# Patient Record
Sex: Female | Born: 2000 | Race: White | Hispanic: No | Marital: Single | Smoking: Former smoker
Health system: Southern US, Community
[De-identification: ages and names within clinical notes are randomized; demographics above are authoritative.]

## PROBLEM LIST (undated history)

## (undated) ENCOUNTER — Inpatient Hospital Stay (HOSPITAL_COMMUNITY): Payer: Medicaid Other

## (undated) DIAGNOSIS — Z789 Other specified health status: Secondary | ICD-10-CM

## (undated) DIAGNOSIS — O139 Gestational [pregnancy-induced] hypertension without significant proteinuria, unspecified trimester: Secondary | ICD-10-CM

## (undated) HISTORY — DX: Gestational (pregnancy-induced) hypertension without significant proteinuria, unspecified trimester: O13.9

## (undated) HISTORY — DX: Other specified health status: Z78.9

## (undated) HISTORY — PX: APPENDECTOMY: SHX54

---
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
## 2020-01-03 ENCOUNTER — Other Ambulatory Visit: Payer: Self-pay

## 2020-01-03 ENCOUNTER — Ambulatory Visit (INDEPENDENT_AMBULATORY_CARE_PROVIDER_SITE_OTHER): Payer: Medicaid Other | Admitting: Advanced Practice Midwife

## 2020-01-03 VITALS — BP 136/79 | HR 123 | Wt 182.0 lb

## 2020-01-03 DIAGNOSIS — Z3A32 32 weeks gestation of pregnancy: Secondary | ICD-10-CM

## 2020-01-03 DIAGNOSIS — Z1389 Encounter for screening for other disorder: Secondary | ICD-10-CM

## 2020-01-03 DIAGNOSIS — Z3403 Encounter for supervision of normal first pregnancy, third trimester: Secondary | ICD-10-CM

## 2020-01-03 DIAGNOSIS — Z331 Pregnant state, incidental: Secondary | ICD-10-CM

## 2020-01-03 LAB — POCT URINALYSIS DIPSTICK OB
Blood, UA: NEGATIVE
Glucose, UA: NEGATIVE
Ketones, UA: NEGATIVE
Leukocytes, UA: NEGATIVE
Nitrite, UA: NEGATIVE
POC,PROTEIN,UA: NEGATIVE

## 2020-01-03 NOTE — Patient Instructions (Signed)
Meet the Provider Zoom Sessions      [facility] is now offering FREE monthly 1-hour virtual Zoom sessions for new, current, and prospective patients.        During these sessions, you can:   Learn about our practice, model of care, services   Get answers to questions about pregnancy and birth during COVID   Pick your provider's brain about anything else!    Sessions will be hosted by [facility] Nurse Practitioners, Physician Assistants, Physicians and Midwives          No registration required      2021 Dates:      All at 6pm     October 21st     November 18th   December 16th     January 20th  February 17th    To join one of these meetings, a few minutes before it is set to start:     Copy/paste the link into your web browser:  [email or web address]    OR  Scan the QR code below (open up your camera and point towards QR code; click on tab that pops up on your phone ("zoom")     

## 2020-01-03 NOTE — Progress Notes (Signed)
   LOW-RISK PREGNANCY VISIT Patient name: Stephanie Molina MRN [id number]  Date of birth: 09-May-2000 Chief Complaint:   Routine Prenatal Visit  History of Present Illness:   Stephanie Molina is a 19 y.o. G2P0000 female at [redacted]w[redacted]d with an Estimated Date of Delivery: 02/24/20 being seen today for ongoing management of a low-risk pregnancy.  Today she reports LBP, especially after being on feet all day. Contractions: Irregular. Vag. Bleeding: None.  Movement: Present. denies leaking of fluid. Review of Systems:   Pertinent items are noted in HPI Denies abnormal vaginal discharge w/ itching/odor/irritation, headaches, visual changes, shortness of breath, chest pain, abdominal pain, severe nausea/vomiting, or problems with urination or bowel movements unless otherwise stated above. Pertinent History Reviewed:  Reviewed past medical,surgical, social, obstetrical and family history.  Reviewed problem list, medications and allergies. Physical Assessment:   Vitals:   01/03/20 1503  BP: 136/79  Pulse: (!) 123  Weight: 182 lb (82.6 kg)  Body mass index is 30.52 kg/m.        Physical Examination:   General appearance: Well appearing, and in no distress  Mental status: Alert, oriented to person, place, and time  Skin: Warm & dry  Cardiovascular: Normal heart rate noted  Respiratory: Normal respiratory effort, no distress  Abdomen: Soft, gravid, nontender  Pelvic: Cervical exam deferred         Extremities: Edema: None  Fetal Status: Fetal Heart Rate (bpm): 148 Fundal Height: 32 cm Movement: Present    Chaperone: n/a    Results for orders placed or performed in visit on 01/03/20 (from the past 24 hour(s))  POC Urinalysis Dipstick OB   Collection Time: 01/03/20  3:02 PM  Result Value Ref Range   Color, UA     Clarity, UA     Glucose, UA Negative Negative   Bilirubin, UA     Ketones, UA neg    Spec Grav, UA     Blood, UA neg    pH, UA     POC,PROTEIN,UA Negative Negative, Trace, Small  (1+), Moderate (2+), Large (3+), 4+   Urobilinogen, UA     Nitrite, UA neg    Leukocytes, UA Negative Negative   Appearance     Odor      Assessment & Plan:  1) Low-risk pregnancy G1P0000 at [redacted]w[redacted]d with an Estimated Date of Delivery: 02/24/20   2) LBP, try maternity belt   Meds: No orders of the defined types were placed in this encounter.  Labs/procedures today:   Plan:  Continue routine obstetrical care  Next visit: prefers online    Reviewed: Preterm labor symptoms and general obstetric precautions including but not limited to vaginal bleeding, contractions, leaking of fluid and fetal movement were reviewed in detail with the patient.  All questions were answered. Has home bp cuff. Check bp weekly, let us know if >140/90.   Follow-up: Return in about 2 weeks (around 01/17/2020) for [facility] Mychart visit.  Orders Placed This Encounter  Procedures  . POC Urinalysis Dipstick OB   Jacklyn Shell DNP, CNM 01/03/2020 3:16 PM

## 2020-01-17 ENCOUNTER — Telehealth: Payer: Medicaid Other | Admitting: Women's Health

## 2020-01-22 NOTE — L&D Delivery Note (Addendum)
OB/GYN Faculty Practice Delivery Note  Stephanie Molina is a 20 y.o. G1P0000 at [redacted]w[redacted]d. She was admitted for PROM on 02/12/20.   ROM: 10h 65m with clear fluid GBS Status: positive, PCN ppx adequate Maximum Maternal Temperature: 98.6*F  Labor Progress: . Patient received cytotec and FB, and then started on pitocin. AROM at 2300 1/22 and IUPC placed and progressed to complete.   Delivery Date/Time: 02/13/20 at [phone] Delivery: Called to room and patient was complete and pushing. Head delivered LOA, with compound right hand and tight nuchal cord x1, delivered through. Shoulder and body delivered in usual fashion. Infant with spontaneous cry, placed on mother's abdomen, dried and stimulated. Cord clamped x 2 after 1-minute delay, and cut by FOB. Cord blood drawn. Placenta delivered spontaneously with gentle cord traction. Fundus firm with massage and Pitocin. Labia, perineum, vagina, and cervix inspected with first degree perineal laceration and left labial wall laceration.   Placenta: spontaneous, intact, 3VC Complications: none Lacerations: first degree perineal, left labial wall, repaired 3.0 and 2.0 vicryl EBL: 402 cc Analgesia: epidural  Infant: female  APGARs 8,9  weight pending per medical chart  to newborn/skin-to-skin  Shirlean Mylar, MD [facility], PGY-2 02/13/2020, 10:18 AM   GME ATTESTATION:  I saw and evaluated the patient. I agree with the findings and the plan of care as documented in the resident's note.  Alric Seton, MD OB Fellow, Faculty [facility], [facility] for [facility] 02/13/2020 11:10 AM

## 2020-01-28 ENCOUNTER — Telehealth (INDEPENDENT_AMBULATORY_CARE_PROVIDER_SITE_OTHER): Payer: Medicaid Other | Admitting: Medical

## 2020-01-28 ENCOUNTER — Other Ambulatory Visit: Payer: Self-pay

## 2020-01-28 ENCOUNTER — Encounter: Payer: Self-pay | Admitting: Medical

## 2020-01-28 VITALS — BP 113/83

## 2020-01-28 DIAGNOSIS — Z3A36 36 weeks gestation of pregnancy: Secondary | ICD-10-CM | POA: Diagnosis not present

## 2020-01-28 DIAGNOSIS — Z3403 Encounter for supervision of normal first pregnancy, third trimester: Secondary | ICD-10-CM

## 2020-01-28 DIAGNOSIS — Z3402 Encounter for supervision of normal first pregnancy, second trimester: Secondary | ICD-10-CM

## 2020-01-28 NOTE — Patient Instructions (Signed)
Fetal Movement Counts Patient Name: ________________________________________________ Patient Due Date: ____________________ What is a fetal movement count?  A fetal movement count is the number of times that you feel your baby move during a certain amount of time. This may also be called a fetal kick count. A fetal movement count is recommended for every pregnant woman. You may be asked to start counting fetal movements as early as week 28 of your pregnancy. Pay attention to when your baby is most active. You may notice your baby's sleep and wake cycles. You may also notice things that make your baby move more. You should do a fetal movement count:  When your baby is normally most active.  At the same time each day. A good time to count movements is while you are resting, after having something to eat and drink. How do I count fetal movements? 1. Find a quiet, comfortable area. Sit, or lie down on your side. 2. Write down the date, the start time and stop time, and the number of movements that you felt between those two times. Take this information with you to your health care visits. 3. Write down your start time when you feel the first movement. 4. Count kicks, flutters, swishes, rolls, and jabs. You should feel at least 10 movements. 5. You may stop counting after you have felt 10 movements, or if you have been counting for 2 hours. Write down the stop time. 6. If you do not feel 10 movements in 2 hours, contact your health care provider for further instructions. Your health care provider may want to do additional tests to assess your baby's well-being. Contact a health care provider if:  You feel fewer than 10 movements in 2 hours.  Your baby is not moving like he or she usually does. Date: ____________ Start time: ____________ Stop time: ____________ Movements: ____________ Date: ____________ Start time: ____________ Stop time: ____________ Movements: ____________ Date: ____________  Start time: ____________ Stop time: ____________ Movements: ____________ Date: ____________ Start time: ____________ Stop time: ____________ Movements: ____________ Date: ____________ Start time: ____________ Stop time: ____________ Movements: ____________ Date: ____________ Start time: ____________ Stop time: ____________ Movements: ____________ Date: ____________ Start time: ____________ Stop time: ____________ Movements: ____________ Date: ____________ Start time: ____________ Stop time: ____________ Movements: ____________ Date: ____________ Start time: ____________ Stop time: ____________ Movements: ____________ This information is not intended to replace advice given to you by your health care provider. Make sure you discuss any questions you have with your health care provider. Document Revised: 08/27/2018 Document Reviewed: 08/27/2018 Elsevier Patient Education  2020 [employer] Braxton Hicks Contractions Contractions of the uterus can occur throughout pregnancy, but they are not always a sign that you are in labor. You may have practice contractions called Braxton Hicks contractions. These false labor contractions are sometimes confused with true labor. What are Braxton Hicks contractions? Braxton Hicks contractions are tightening movements that occur in the muscles of the uterus before labor. Unlike true labor contractions, these contractions do not result in opening (dilation) and thinning of the cervix. Toward the end of pregnancy (32-34 weeks), Braxton Hicks contractions can happen more often and may become stronger. These contractions are sometimes difficult to tell apart from true labor because they can be very uncomfortable. You should not feel embarrassed if you go to the hospital with false labor. Sometimes, the only way to tell if you are in true labor is for your health care provider to look for changes in the cervix. The health care provider   will do a physical exam and may  monitor your contractions. If you are not in true labor, the exam should show that your cervix is not dilating and your water has not broken. If there are no other health problems associated with your pregnancy, it is completely safe for you to be sent home with false labor. You may continue to have Braxton Hicks contractions until you go into true labor. How to tell the difference between true labor and false labor True labor  Contractions last 30-70 seconds.  Contractions become very regular.  Discomfort is usually felt in the top of the uterus, and it spreads to the lower abdomen and low back.  Contractions do not go away with walking.  Contractions usually become more intense and increase in frequency.  The cervix dilates and gets thinner. False labor  Contractions are usually shorter and not as strong as true labor contractions.  Contractions are usually irregular.  Contractions are often felt in the front of the lower abdomen and in the groin.  Contractions may go away when you walk around or change positions while lying down.  Contractions get weaker and are shorter-lasting as time goes on.  The cervix usually does not dilate or become thin. Follow these instructions at home:   Take over-the-counter and prescription medicines only as told by your health care provider.  Keep up with your usual exercises and follow other instructions from your health care provider.  Eat and drink lightly if you think you are going into labor.  If Braxton Hicks contractions are making you uncomfortable: ? Change your position from lying down or resting to walking, or change from walking to resting. ? Sit and rest in a tub of warm water. ? Drink enough fluid to keep your urine pale yellow. Dehydration may cause these contractions. ? Do slow and deep breathing several times an hour.  Keep all follow-up prenatal visits as told by your health care provider. This is important. Contact a  health care provider if:  You have a fever.  You have continuous pain in your abdomen. Get help right away if:  Your contractions become stronger, more regular, and closer together.  You have fluid leaking or gushing from your vagina.  You pass blood-tinged mucus (bloody show).  You have bleeding from your vagina.  You have low back pain that you never had before.  You feel your baby's head pushing down and causing pelvic pressure.  Your baby is not moving inside you as much as it used to. Summary  Contractions that occur before labor are called Braxton Hicks contractions, false labor, or practice contractions.  Braxton Hicks contractions are usually shorter, weaker, farther apart, and less regular than true labor contractions. True labor contractions usually become progressively stronger and regular, and they become more frequent.  Manage discomfort from Braxton Hicks contractions by changing position, resting in a warm bath, drinking plenty of water, or practicing deep breathing. This information is not intended to replace advice given to you by your health care provider. Make sure you discuss any questions you have with your health care provider. Document Revised: 12/20/2016 Document Reviewed: 05/23/2016 Elsevier Patient Education  2020 Elsevier Inc.  

## 2020-01-28 NOTE — Progress Notes (Signed)
I connected with Stephanie Molina 01/28/20 at 10:50 AM EST by: MyChart video and verified that I am speaking with the correct person using two identifiers.  Patient is located at home and provider is located at [facility].     The purpose of this virtual visit is to provide medical care while limiting exposure to the novel coronavirus. I discussed the limitations, risks, security and privacy concerns of performing an evaluation and management service by MyChart video and the availability of in person appointments. I also discussed with the patient that there may be a patient responsible charge related to this service. By engaging in this virtual visit, you consent to the provision of healthcare.  Additionally, you authorize for your insurance to be billed for the services provided during this visit.  The patient expressed understanding and agreed to proceed.  The following staff members participated in the virtual visit:  Jobe Marker, RN    PRENATAL VISIT NOTE  Subjective:  Stephanie Molina is a 20 y.o. G1P0000 at [redacted]w[redacted]d  for phone visit for ongoing prenatal care.  She is currently monitored for the following issues for this low-risk pregnancy and has Acute appendicitis and Supervision of normal first pregnancy on their problem list.  Patient reports no complaints.  Contractions: Irregular. Vag. Bleeding: None.  Movement: Present. Denies leaking of fluid.   The following portions of the patient's history were reviewed and updated as appropriate: allergies, current medications, past family history, past medical history, past social history, past surgical history and problem list.   Objective:   Vitals:   01/28/20 1038  BP: 113/83   Self-Obtained  Fetal Status:     Movement: Present     Assessment and Plan:  Pregnancy: G1P0000 at [redacted]w[redacted]d 1. Encounter for supervision of normal first pregnancy in second trimester - Doing well - Anticipatory guidance for next visit discussed including need for  GBS and GC/Chlamydia   2. [redacted] weeks gestation of pregnancy  Preterm labor symptoms and general obstetric precautions including but not limited to vaginal bleeding, contractions, leaking of fluid and fetal movement were reviewed in detail with the patient.  Return in about 1 week (around 02/04/2020) for LOB, In-Person.  No future appointments.   Time spent on virtual visit: 7 minutes  Vonzella Nipple, PA-C

## 2020-02-03 ENCOUNTER — Ambulatory Visit (INDEPENDENT_AMBULATORY_CARE_PROVIDER_SITE_OTHER): Payer: Medicaid Other | Admitting: Obstetrics & Gynecology

## 2020-02-03 ENCOUNTER — Other Ambulatory Visit (HOSPITAL_COMMUNITY)
Admission: RE | Admit: 2020-02-03 | Discharge: 2020-02-03 | Disposition: A | Discharge status: Home / Self Care | Payer: Medicaid Other | Source: Ambulatory Visit | Attending: Obstetrics & Gynecology | Admitting: Obstetrics & Gynecology

## 2020-02-03 ENCOUNTER — Encounter: Payer: Self-pay | Admitting: Obstetrics & Gynecology

## 2020-02-03 ENCOUNTER — Other Ambulatory Visit: Payer: Self-pay

## 2020-02-03 VITALS — BP 120/81 | HR 105 | Wt 191.4 lb

## 2020-02-03 DIAGNOSIS — Z1389 Encounter for screening for other disorder: Secondary | ICD-10-CM

## 2020-02-03 DIAGNOSIS — Z3403 Encounter for supervision of normal first pregnancy, third trimester: Secondary | ICD-10-CM

## 2020-02-03 DIAGNOSIS — Z331 Pregnant state, incidental: Secondary | ICD-10-CM

## 2020-02-03 DIAGNOSIS — Z3A37 37 weeks gestation of pregnancy: Secondary | ICD-10-CM

## 2020-02-03 LAB — POCT URINALYSIS DIPSTICK OB
Blood, UA: NEGATIVE
Glucose, UA: NEGATIVE
Ketones, UA: NEGATIVE
Leukocytes, UA: NEGATIVE
Nitrite, UA: NEGATIVE
POC,PROTEIN,UA: NEGATIVE

## 2020-02-03 NOTE — Progress Notes (Signed)
LOW-RISK PREGNANCY VISIT Patient name: Stephanie Molina MRN [id number]  Date of birth: Aug 31, 2000 Chief Complaint:   Routine Prenatal Visit  History of Present Illness:   Stephanie Molina is a 20 y.o. G31P0000 female at [redacted]w[redacted]d with an Estimated Date of Delivery: 02/24/20 being seen today for ongoing management of a low-risk pregnancy.  Depression screen [facility] 2/9 11/26/2019 08/12/2019 06/30/2019  Decreased Interest 0 1 0  Down, Depressed, Hopeless 0 0 0  PHQ - 2 Score 0 1 0  Altered sleeping 0 2 1  Tired, decreased energy 1 1 1   Change in appetite 0 0 1  Feeling bad or failure about yourself  0 0 0  Trouble concentrating 0 0 0  Moving slowly or fidgety/restless 0 0 0  Suicidal thoughts 0 0 0  PHQ-9 Score 1 4 3   Difficult doing work/chores - - Not difficult at all    Today she reports no complaints. Contractions: Irregular.  .  Movement: Present. denies leaking of fluid. Review of Systems:   Pertinent items are noted in HPI Denies abnormal vaginal discharge w/ itching/odor/irritation, headaches, visual changes, shortness of breath, chest pain, abdominal pain, severe nausea/vomiting, or problems with urination or bowel movements unless otherwise stated above. Pertinent History Reviewed:  Reviewed past medical,surgical, social, obstetrical and family history.  Reviewed problem list, medications and allergies. Physical Assessment:   Vitals:   02/03/20 1139  BP: 120/81  Pulse: (!) 105  Weight: 191 lb 6.4 oz (86.8 kg)  Body mass index is 32.1 kg/m.        Physical Examination:   General appearance: Well appearing, and in no distress  Mental status: Alert, oriented to person, place, and time  Skin: Warm & dry  Cardiovascular: Normal heart rate noted  Respiratory: Normal respiratory effort, no distress  Abdomen: Soft, gravid, nontender  Pelvic: Cervical exam performed  Dilation: 1 Effacement (%): Thick Station: -3  Extremities: Edema: None  Fetal Status: Fetal Heart Rate (bpm): 162  Fundal Height: 35 cm Movement: Present Presentation: Vertex  Chaperone: Amanda Rash    Results for orders placed or performed in visit on 02/03/20 (from the past 24 hour(s))  POC Urinalysis Dipstick OB   Collection Time: 02/03/20 11:47 AM  Result Value Ref Range   Color, UA     Clarity, UA     Glucose, UA Negative Negative   Bilirubin, UA     Ketones, UA neg    Spec Grav, UA     Blood, UA neg    pH, UA     POC,PROTEIN,UA Negative Negative, Trace, Small (1+), Moderate (2+), Large (3+), 4+   Urobilinogen, UA     Nitrite, UA neg    Leukocytes, UA Negative Negative   Appearance     Odor      Assessment & Plan:  1) Low-risk pregnancy G1P0000 at [redacted]w[redacted]d with an Estimated Date of Delivery: 02/24/20   2) ,    Meds: No orders of the defined types were placed in this encounter.  Labs/procedures today: cultures  Plan:  Continue routine obstetrical care  Next visit: prefers in person    Reviewed: Term labor symptoms and general obstetric precautions including but not limited to vaginal bleeding, contractions, leaking of fluid and fetal movement were reviewed in detail with the patient.  All questions were answered. Has home bp cuff. Rx faxed to . Check bp weekly, let [redacted]w[redacted]d know if >140/90.   Follow-up: Return in about 1 week (around 02/10/2020) for LROB.  Orders Placed This Encounter  Procedures  . Strep Gp B NAA  . POC Urinalysis Dipstick OB    Lazaro Arms, MD 02/03/2020 12:03 PM

## 2020-02-04 LAB — CERVICOVAGINAL ANCILLARY ONLY
Chlamydia: NEGATIVE
Comment: NEGATIVE
Comment: NORMAL
Neisseria Gonorrhea: NEGATIVE

## 2020-02-05 LAB — STREP GP B NAA: Strep Gp B NAA: POSITIVE — AB

## 2020-02-10 ENCOUNTER — Encounter: Payer: Self-pay | Admitting: *Deleted

## 2020-02-11 ENCOUNTER — Inpatient Hospital Stay (HOSPITAL_COMMUNITY)
Admission: AD | Admit: 2020-02-11 | Discharge: 2020-02-15 | DRG: 807 | Disposition: A | Discharge status: Home / Self Care | Payer: Medicaid Other | Attending: Family Medicine | Admitting: Family Medicine

## 2020-02-11 ENCOUNTER — Encounter (HOSPITAL_COMMUNITY): Payer: Self-pay | Admitting: Family Medicine

## 2020-02-11 ENCOUNTER — Encounter: Payer: Medicaid Other | Admitting: Obstetrics and Gynecology

## 2020-02-11 ENCOUNTER — Other Ambulatory Visit: Payer: Self-pay

## 2020-02-11 ENCOUNTER — Ambulatory Visit (INDEPENDENT_AMBULATORY_CARE_PROVIDER_SITE_OTHER): Payer: Medicaid Other | Admitting: Women's Health

## 2020-02-11 ENCOUNTER — Encounter: Payer: Self-pay | Admitting: Women's Health

## 2020-02-11 VITALS — BP 139/88 | HR 98 | Wt 196.0 lb

## 2020-02-11 DIAGNOSIS — E669 Obesity, unspecified: Secondary | ICD-10-CM | POA: Diagnosis present

## 2020-02-11 DIAGNOSIS — Z3400 Encounter for supervision of normal first pregnancy, unspecified trimester: Secondary | ICD-10-CM

## 2020-02-11 DIAGNOSIS — O99214 Obesity complicating childbirth: Secondary | ICD-10-CM | POA: Diagnosis present

## 2020-02-11 DIAGNOSIS — O139 Gestational [pregnancy-induced] hypertension without significant proteinuria, unspecified trimester: Secondary | ICD-10-CM | POA: Diagnosis present

## 2020-02-11 DIAGNOSIS — Z3A38 38 weeks gestation of pregnancy: Secondary | ICD-10-CM

## 2020-02-11 DIAGNOSIS — Z3403 Encounter for supervision of normal first pregnancy, third trimester: Secondary | ICD-10-CM

## 2020-02-11 DIAGNOSIS — O4292 Full-term premature rupture of membranes, unspecified as to length of time between rupture and onset of labor: Secondary | ICD-10-CM | POA: Diagnosis present

## 2020-02-11 DIAGNOSIS — Z23 Encounter for immunization: Secondary | ICD-10-CM

## 2020-02-11 DIAGNOSIS — O326XX0 Maternal care for compound presentation, not applicable or unspecified: Secondary | ICD-10-CM | POA: Diagnosis not present

## 2020-02-11 DIAGNOSIS — Z20822 Contact with and (suspected) exposure to covid-19: Secondary | ICD-10-CM | POA: Diagnosis present

## 2020-02-11 DIAGNOSIS — O135 Gestational [pregnancy-induced] hypertension without significant proteinuria, complicating the puerperium: Secondary | ICD-10-CM | POA: Diagnosis not present

## 2020-02-11 DIAGNOSIS — Z87891 Personal history of nicotine dependence: Secondary | ICD-10-CM

## 2020-02-11 DIAGNOSIS — O358XX0 Maternal care for other (suspected) fetal abnormality and damage, not applicable or unspecified: Secondary | ICD-10-CM | POA: Diagnosis present

## 2020-02-11 DIAGNOSIS — O133 Gestational [pregnancy-induced] hypertension without significant proteinuria, third trimester: Secondary | ICD-10-CM

## 2020-02-11 DIAGNOSIS — O134 Gestational [pregnancy-induced] hypertension without significant proteinuria, complicating childbirth: Principal | ICD-10-CM | POA: Diagnosis present

## 2020-02-11 DIAGNOSIS — O99824 Streptococcus B carrier state complicating childbirth: Secondary | ICD-10-CM | POA: Diagnosis present

## 2020-02-11 LAB — CBC WITH DIFFERENTIAL/PLATELET
Abs Immature Granulocytes: 0.27 10*3/uL — ABNORMAL HIGH (ref 0.00–0.07)
Basophils Absolute: 0 10*3/uL (ref 0.0–0.1)
Basophils Relative: 0 %
Eosinophils Absolute: 0.1 10*3/uL (ref 0.0–0.5)
Eosinophils Relative: 1 %
HCT: 39.5 % (ref 36.0–46.0)
Hemoglobin: 13 g/dL (ref 12.0–15.0)
Immature Granulocytes: 2 %
Lymphocytes Relative: 19 %
Lymphs Abs: 2.7 10*3/uL (ref 0.7–4.0)
MCH: 30.2 pg (ref 26.0–34.0)
MCHC: 32.9 g/dL (ref 30.0–36.0)
MCV: 91.9 fL (ref 80.0–100.0)
Monocytes Absolute: 0.8 10*3/uL (ref 0.1–1.0)
Monocytes Relative: 6 %
Neutro Abs: 10.1 10*3/uL — ABNORMAL HIGH (ref 1.7–7.7)
Neutrophils Relative %: 72 %
Platelets: 190 10*3/uL (ref 150–400)
RBC: 4.3 MIL/uL (ref 3.87–5.11)
RDW: 12.9 % (ref 11.5–15.5)
WBC: 14 10*3/uL — ABNORMAL HIGH (ref 4.0–10.5)
nRBC: 0 % (ref 0.0–0.2)

## 2020-02-11 LAB — POCT URINALYSIS DIPSTICK OB
Blood, UA: NEGATIVE
Glucose, UA: NEGATIVE
Ketones, UA: NEGATIVE
Leukocytes, UA: NEGATIVE
Nitrite, UA: NEGATIVE
POC,PROTEIN,UA: NEGATIVE

## 2020-02-11 LAB — URINALYSIS, ROUTINE W REFLEX MICROSCOPIC
Bilirubin Urine: NEGATIVE
Glucose, UA: NEGATIVE mg/dL
Hgb urine dipstick: NEGATIVE
Ketones, ur: NEGATIVE mg/dL
Leukocytes,Ua: NEGATIVE
Nitrite: NEGATIVE
Protein, ur: NEGATIVE mg/dL
Specific Gravity, Urine: 1.005 (ref 1.005–1.030)
pH: 7 (ref 5.0–8.0)

## 2020-02-11 LAB — COMPREHENSIVE METABOLIC PANEL
ALT: 14 U/L (ref 0–44)
AST: 15 U/L (ref 15–41)
Albumin: 3 g/dL — ABNORMAL LOW (ref 3.5–5.0)
Alkaline Phosphatase: 132 U/L — ABNORMAL HIGH (ref 38–126)
Anion gap: 11 (ref 5–15)
BUN: 5 mg/dL — ABNORMAL LOW (ref 6–20)
CO2: 22 mmol/L (ref 22–32)
Calcium: 9 mg/dL (ref 8.9–10.3)
Chloride: 103 mmol/L (ref 98–111)
Creatinine, Ser: 0.64 mg/dL (ref 0.44–1.00)
GFR, Estimated: >60 mL/min (ref >60)
Glucose, Bld: 111 mg/dL — ABNORMAL HIGH (ref 70–99)
Potassium: 3.9 mmol/L (ref 3.5–5.1)
Sodium: 136 mmol/L (ref 135–145)
Total Bilirubin: 0.6 mg/dL (ref 0.3–1.2)
Total Protein: 6 g/dL — ABNORMAL LOW (ref 6.5–8.1)

## 2020-02-11 LAB — SARS CORONAVIRUS 2 BY RT PCR (HOSPITAL ORDER, PERFORMED IN ~~LOC~~ HOSPITAL LAB): SARS Coronavirus 2: NEGATIVE

## 2020-02-11 LAB — PROTEIN / CREATININE RATIO, URINE
Creatinine, Urine: 40.28 mg/dL
Total Protein, Urine: <6 mg/dL

## 2020-02-11 LAB — TYPE AND SCREEN
ABO/RH(D): A POS
Antibody Screen: NEGATIVE

## 2020-02-11 MED ORDER — ACETAMINOPHEN 325 MG PO TABS
650.0000 mg | ORAL_TABLET | ORAL | Status: DC | PRN
  Administered 2020-02-11: 650 mg via ORAL
  Filled 2020-02-11: qty 2

## 2020-02-11 MED ORDER — OXYCODONE-ACETAMINOPHEN 5-325 MG PO TABS: 2.0000 | ORAL_TABLET | ORAL | Status: DC | PRN

## 2020-02-11 MED ORDER — LACTATED RINGERS IV SOLN: 500.0000 mL | INTRAVENOUS | Status: DC | PRN

## 2020-02-11 MED ORDER — SOD CITRATE-CITRIC ACID 500-334 MG/5ML PO SOLN
30.0000 mL | ORAL | Status: DC | PRN
  Administered 2020-02-13: 30 mL via ORAL
  Filled 2020-02-11: qty 15

## 2020-02-11 MED ORDER — OXYTOCIN BOLUS FROM INFUSION
333.0000 mL | Freq: Once | INTRAVENOUS | Status: AC
  Administered 2020-02-13: 333 mL via INTRAVENOUS

## 2020-02-11 MED ORDER — MISOPROSTOL 50MCG HALF TABLET
50.0000 ug | ORAL_TABLET | ORAL | Status: DC | PRN
  Administered 2020-02-11 – 2020-02-12 (×4): 50 ug via BUCCAL
  Filled 2020-02-11 (×4): qty 1

## 2020-02-11 MED ORDER — OXYTOCIN-SODIUM CHLORIDE 30-0.9 UT/500ML-% IV SOLN: 2.5000 [IU]/h | INTRAVENOUS | Status: DC

## 2020-02-11 MED ORDER — SODIUM CHLORIDE 0.9 % IV SOLN
5.0000 10*6.[IU] | Freq: Once | INTRAVENOUS | Status: AC
  Administered 2020-02-11: 5 10*6.[IU] via INTRAVENOUS
  Filled 2020-02-11: qty 5

## 2020-02-11 MED ORDER — OXYTOCIN-SODIUM CHLORIDE 30-0.9 UT/500ML-% IV SOLN
1.0000 m[IU]/min | INTRAVENOUS | Status: DC
  Administered 2020-02-12: 2 m[IU]/min via INTRAVENOUS
  Administered 2020-02-13: 24 m[IU]/min via INTRAVENOUS
  Filled 2020-02-11 (×2): qty 500

## 2020-02-11 MED ORDER — LACTATED RINGERS IV SOLN: INTRAVENOUS | Status: DC

## 2020-02-11 MED ORDER — TERBUTALINE SULFATE 1 MG/ML IJ SOLN: 0.2500 mg | Freq: Once | INTRAMUSCULAR | Status: DC | PRN

## 2020-02-11 MED ORDER — ONDANSETRON HCL 4 MG/2ML IJ SOLN
4.0000 mg | Freq: Four times a day (QID) | INTRAMUSCULAR | Status: DC | PRN
  Administered 2020-02-13: 4 mg via INTRAVENOUS
  Filled 2020-02-11: qty 2

## 2020-02-11 MED ORDER — LIDOCAINE HCL (PF) 1 % IJ SOLN: 30.0000 mL | INTRAMUSCULAR | Status: DC | PRN

## 2020-02-11 MED ORDER — OXYCODONE-ACETAMINOPHEN 5-325 MG PO TABS: 1.0000 | ORAL_TABLET | ORAL | Status: DC | PRN

## 2020-02-11 MED ORDER — FENTANYL CITRATE (PF) 100 MCG/2ML IJ SOLN
50.0000 ug | INTRAMUSCULAR | Status: DC | PRN
  Administered 2020-02-11: 50 ug via INTRAVENOUS
  Administered 2020-02-12: 100 ug via INTRAVENOUS
  Filled 2020-02-11 (×2): qty 2

## 2020-02-11 MED ORDER — PENICILLIN G POT IN DEXTROSE 60000 UNIT/ML IV SOLN
3.0000 10*6.[IU] | INTRAVENOUS | Status: DC
  Administered 2020-02-11 – 2020-02-13 (×9): 3 10*6.[IU] via INTRAVENOUS
  Filled 2020-02-11 (×9): qty 50

## 2020-02-11 NOTE — MAU Note (Signed)
Sent from MD office for BP evaluation.  Reports BP was elevated @ office visit and at home last night.  BP 157/87 last night when checked at home.  Denies h/A, visual disturbances, or epigastric pain.  Endorses +FM.

## 2020-02-11 NOTE — MAU Provider Note (Signed)
Chief Complaint:  BP Evaluation   Event Date/Time   First Provider Initiated Contact with Patient 02/11/20 1432     HPI: Stephanie Molina is a 20 y.o. G1P0000 at [redacted]w[redacted]d who presents to maternity admissions reporting hypertension. Denies vaginal bleeding, leaking of fluid, decreased fetal movement, fever, falls, or recent illness.   Patient reports that her BP last night was 157/87 and today in the office it was 150/93 at 11:00am and 139/88.  She denies any HA, visual disturbance or RUQ pain. She was "tipping over" yesterday at the grocery store because she felt hot. She didn't pass out, she was just leaning to the side. She denies any pain at the moment.     Pregnancy Course: She denies any complications with this pregnancy. She denies any hx of CHTN.   Past Medical History:  Diagnosis Date   Medical history non-contributory    OB History  Gravida Para Term Preterm AB Living  1 0 0 0 0 0  SAB IAB Ectopic Multiple Live Births  0 0 0 0 0    # Outcome Date GA Lbr Len/2nd Weight Sex Delivery Anes PTL Lv  1 Current            Past Surgical History:  Procedure Laterality Date   APPENDECTOMY     LAPAROSCOPIC APPENDECTOMY N/A 02/11/2019   Procedure: APPENDECTOMY LAPAROSCOPIC;  Surgeon: Berna Bue, MD;  Location: [facility];  Service: General;  Laterality: N/A;   Family History  Problem Relation Age of Onset   Heart attack Paternal Grandfather    Asthma Paternal Grandmother    Asthma Father    Social History   Tobacco Use   Smoking status: Former Smoker    Types: E-cigarettes   Smokeless tobacco: Never Used  Building services engineer Use: Never used  Substance Use Topics   Alcohol use: No   Drug use: No   Allergies  Allergen Reactions   Rocephin [Ceftriaxone Sodium In Dextrose] Swelling    Pt had eye swelling    Medications Prior to Admission  Medication Sig Dispense Refill Last Dose   Blood Pressure Monitor MISC For regular home bp monitoring during  pregnancy 1 each 0 02/10/2020 at Unknown time   Doxylamine-Pyridoxine (DICLEGIS) 10-10 MG TBEC 2 tabs q hs, if sx persist add 1 tab q am on day 3, if sx persist add 1 tab q afternoon on day 4 100 tablet 6 Past Week at Unknown time   Prenatal Vit-Fe Fumarate-FA (PRENATAL VITAMIN PO) Take by mouth.   02/10/2020 at Unknown time    I have reviewed patient's Past Medical Hx, Surgical Hx, Family Hx, Social Hx, medications and allergies.   ROS:  Review of Systems  Constitutional: Negative for chills and fever.  Gastrointestinal: Negative for nausea and vomiting.  Genitourinary: Negative for dysuria, pelvic pain, vaginal bleeding and vaginal discharge.    Physical Exam   Patient Vitals for the past 24 hrs:  BP Temp Temp src Pulse Resp SpO2 Height Weight  02/11/20 1531 121/74 -- -- (!) 119 -- -- -- --  02/11/20 1530 -- -- -- -- -- 97 % -- --  02/11/20 1525 -- -- -- -- -- 97 % -- --  02/11/20 1520 -- -- -- -- -- 97 % -- --  02/11/20 1516 116/86 -- -- (!) 134 -- -- -- --  02/11/20 1515 -- -- -- -- -- 97 % -- --  02/11/20 1510 -- -- -- -- -- 98 % -- --  02/11/20 1505 -- -- -- -- -- 98 % -- --  02/11/20 1501 (!) 140/96 -- -- (!) 128 -- -- -- --  02/11/20 1500 -- -- -- -- -- 98 % -- --  02/11/20 1455 -- -- -- -- -- 98 % -- --  02/11/20 1450 -- -- -- -- -- 97 % -- --  02/11/20 1446 (!) 142/85 -- -- (!) 132 -- -- -- --  02/11/20 1445 -- -- -- -- -- 98 % -- --  02/11/20 1440 -- -- -- -- -- 97 % -- --  02/11/20 1435 -- -- -- -- -- 97 % -- --  02/11/20 1431 133/82 -- -- (!) 124 -- -- -- --  02/11/20 1430 -- -- -- -- -- 98 % -- --  02/11/20 1425 -- -- -- -- -- 98 % -- --  02/11/20 1420 -- -- -- -- -- 97 % -- --  02/11/20 1356 136/90 97.7 F (36.5 C) Oral (!) 125 18 100 % 5\' 5"  (1.651 m) 89.2 kg    Constitutional: Well-developed, well-nourished female in no acute distress.  Cardiovascular: normal rate & rhythm, no murmur Respiratory: normal effort, lung sounds clear throughout GI: Abd soft,  non-tender, gravid appropriate for gestational age. Pos BS x 4 MS: Extremities nontender, no edema, normal ROM Neurologic: Alert and oriented x 4.  GU: no CVA tenderness Pelvic: NEFG, physiologic discharge, no blood, cervix clean.      Fetal Tracing: Baseline: 145 Variability: moderate Accelerations: 15x15 Decelerations: none Toco: none   Labs: Results for orders placed or performed during the hospital encounter of 02/11/20 (from the past 24 hour(s))  CBC with Differential/Platelet     Status: Abnormal   Collection Time: 02/11/20  2:05 PM  Result Value Ref Range   WBC 14.0 (H) 4.0 - 10.5 K/uL   RBC 4.30 3.87 - 5.11 MIL/uL   Hemoglobin 13.0 12.0 - 15.0 g/dL   HCT 02/13/20 35.4 - 65.6 %   MCV 91.9 80.0 - 100.0 fL   MCH 30.2 26.0 - 34.0 pg   MCHC 32.9 30.0 - 36.0 g/dL   RDW 81.2 75.1 - 70.0 %   Platelets 190 150 - 400 K/uL   nRBC 0.0 0.0 - 0.2 %   Neutrophils Relative % 72 %   Neutro Abs 10.1 (H) 1.7 - 7.7 K/uL   Lymphocytes Relative 19 %   Lymphs Abs 2.7 0.7 - 4.0 K/uL   Monocytes Relative 6 %   Monocytes Absolute 0.8 0.1 - 1.0 K/uL   Eosinophils Relative 1 %   Eosinophils Absolute 0.1 0.0 - 0.5 K/uL   Basophils Relative 0 %   Basophils Absolute 0.0 0.0 - 0.1 K/uL   Immature Granulocytes 2 %   Abs Immature Granulocytes 0.27 (H) 0.00 - 0.07 K/uL  Comprehensive metabolic panel     Status: Abnormal   Collection Time: 02/11/20  2:05 PM  Result Value Ref Range   Sodium 136 135 - 145 mmol/L   Potassium 3.9 3.5 - 5.1 mmol/L   Chloride 103 98 - 111 mmol/L   CO2 22 22 - 32 mmol/L   Glucose, Bld 111 (H) 70 - 99 mg/dL   BUN 5 (L) 6 - 20 mg/dL   Creatinine, Ser 02/13/20 0.44 - 1.00 mg/dL   Calcium 9.0 8.9 - 9.44 mg/dL   Total Protein 6.0 (L) 6.5 - 8.1 g/dL   Albumin 3.0 (L) 3.5 - 5.0 g/dL   AST 15 15 - 41 U/L   ALT 14  0 - 44 U/L   Alkaline Phosphatase 132 (H) 38 - 126 U/L   Total Bilirubin 0.6 0.3 - 1.2 mg/dL   GFR, Estimated >95 >63 mL/min   Anion gap 11 5 - 15  Protein /  creatinine ratio, urine     Status: None   Collection Time: 02/11/20  2:13 PM  Result Value Ref Range   Creatinine, Urine 40.28 mg/dL   Total Protein, Urine <6 mg/dL   Protein Creatinine Ratio        0.00 - 0.15 mg/mg[Cre]  Urinalysis, Routine w reflex microscopic Urine, Clean Catch     Status: Abnormal   Collection Time: 02/11/20  2:13 PM  Result Value Ref Range   Color, Urine STRAW (A) YELLOW   APPearance CLEAR CLEAR   Specific Gravity, Urine 1.005 1.005 - 1.030   pH 7.0 5.0 - 8.0   Glucose, UA NEGATIVE NEGATIVE mg/dL   Hgb urine dipstick NEGATIVE NEGATIVE   Bilirubin Urine NEGATIVE NEGATIVE   Ketones, ur NEGATIVE NEGATIVE mg/dL   Protein, ur NEGATIVE NEGATIVE mg/dL   Nitrite NEGATIVE NEGATIVE   Leukocytes,Ua NEGATIVE NEGATIVE    Imaging:  No results found.  MAU Course: Orders Placed This Encounter  Procedures   CBC with Differential/Platelet   Comprehensive metabolic panel   Protein / creatinine ratio, urine   Urinalysis, Routine w reflex microscopic Urine, Clean Catch   No orders of the defined types were placed in this encounter.   MDM:  Assessment: 1. Gestational hypertension, third trimester   2. Encounter for supervision of normal first pregnancy in third trimester   3. [redacted] weeks gestation of pregnancy     Plan: Admit to labor and delivery  IOL for [facility]  Labor team notified   Thressa Sheller DNP, CNM  02/11/20  3:55 PM

## 2020-02-11 NOTE — Progress Notes (Signed)
LOW-RISK PREGNANCY VISIT Patient name: Stephanie Molina MRN [id number]  Date of birth: July 23, 2000 Chief Complaint:   Routine Prenatal Visit  History of Present Illness:   Stephanie Molina is a 20 y.o. G66P0000 female at [redacted]w[redacted]d with an Estimated Date of Delivery: 02/24/20 being seen today for ongoing management of a low-risk pregnancy.  Depression screen [facility] 2/9 11/26/2019 08/12/2019 06/30/2019  Decreased Interest 0 1 0  Down, Depressed, Hopeless 0 0 0  PHQ - 2 Score 0 1 0  Altered sleeping 0 2 1  Tired, decreased energy 1 1 1   Change in appetite 0 0 1  Feeling bad or failure about yourself  0 0 0  Trouble concentrating 0 0 0  Moving slowly or fidgety/restless 0 0 0  Suicidal thoughts 0 0 0  PHQ-9 Score 1 4 3   Difficult doing work/chores - - Not difficult at all    Today she reports bp at home last night 157/87.  Denies visual changes, ruq/epigastric pain, n/v. Some mild headaches in the mornings that go away on their own.  Contractions: Irregular. Vag. Bleeding: None.  Movement: Present. denies leaking of fluid. Review of Systems:   Pertinent items are noted in HPI Denies abnormal vaginal discharge w/ itching/odor/irritation, headaches, visual changes, shortness of breath, chest pain, abdominal pain, severe nausea/vomiting, or problems with urination or bowel movements unless otherwise stated above. Pertinent History Reviewed:  Reviewed past medical,surgical, social, obstetrical and family history.  Reviewed problem list, medications and allergies. Physical Assessment:   Vitals:   02/11/20 1103 02/11/20 1106  BP: (!) 150/93 139/88  Pulse: 98 98  Weight: 196 lb (88.9 kg)   Body mass index is 32.87 kg/m.        Physical Examination:   General appearance: Well appearing, and in no distress  Mental status: Alert, oriented to person, place, and time  Skin: Warm & dry  Cardiovascular: Normal heart rate noted  Respiratory: Normal respiratory effort, no distress  Abdomen: Soft, gravid,  nontender  Pelvic: Cervical exam performed  Dilation: 1 Effacement (%): Thick Station: -3  Extremities: Edema: None  Fetal Status: Fetal Heart Rate (bpm): 144 Fundal Height: 36 cm Movement: Present Presentation: Vertex  Chaperone: 02/13/20   Results for orders placed or performed in visit on 02/11/20 (from the past 24 hour(s))  POC Urinalysis Dipstick OB   Collection Time: 02/11/20 11:21 AM  Result Value Ref Range   Color, UA     Clarity, UA     Glucose, UA Negative Negative   Bilirubin, UA     Ketones, UA neg    Spec Grav, UA     Blood, UA neg    pH, UA     POC,PROTEIN,UA Negative Negative, Trace, Small (1+), Moderate (2+), Large (3+), 4+   Urobilinogen, UA     Nitrite, UA neg    Leukocytes, UA Negative Negative   Appearance     Odor      Assessment & Plan:  1) Low-risk pregnancy G1P0000 at [redacted]w[redacted]d with an Estimated Date of Delivery: 02/24/20   2) Elevated bp, asymptomatic, no proteinuria. Given this is [redacted]w[redacted]d she can't f/u here tomorrow for bp check, so to MAU for eval. Notified Heather, CNM. If d/c'd call 04/23/20 on Monday to schedule appt for next week.    Meds: No orders of the defined types were placed in this encounter.  Labs/procedures today: sve   Reviewed: Term labor symptoms and general obstetric precautions including but not limited to vaginal  bleeding, contractions, leaking of fluid and fetal movement were reviewed in detail with the patient.  All questions were answered.   Follow-up: Return for will call for appt next week if d/c'd from hospital.  No future appointments.  Orders Placed This Encounter  Procedures  . POC Urinalysis Dipstick OB   Cheral Marker CNM, [facility] 02/11/2020 11:33 AM

## 2020-02-11 NOTE — Progress Notes (Signed)
Pharmacy was consulted to help determine if Penicillin was not contraindicated. It was determined that Rocephin was most likely what caused the allergic reaction in 2020. Pt was educated and was okay with receiving Penicillin.

## 2020-02-11 NOTE — H&P (Signed)
OBSTETRIC ADMISSION HISTORY AND PHYSICAL  Stephanie Molina is a 20 y.o. female G1P0000 with IUP at [redacted]w[redacted]d presenting for IOL for gHTN. She reports +FMs. No LOF, VB, blurry vision, headaches, peripheral edema, or RUQ pain. She plans on breastfeeding. She requests condoms for birth control.  Dating: By LMP --->  Estimated Date of Delivery: 02/24/20  Sono:    @[redacted]w[redacted]d , CPCs resolved, breech presentation, 1060g, 45%ile  Prenatal History/Complications: - gHTN - teen pregnancy - CPCs, resolved at 27w - syncope, referral to cards, pt didn't f/u  Past Medical History: Past Medical History:  Diagnosis Date   Medical history non-contributory     Past Surgical History: Past Surgical History:  Procedure Laterality Date   APPENDECTOMY     LAPAROSCOPIC APPENDECTOMY N/A 02/11/2019   Procedure: APPENDECTOMY LAPAROSCOPIC;  Surgeon: 02/13/2019, MD;  Location: [facility];  Service: General;  Laterality: N/A;    Obstetrical History: OB History    Gravida  1   Para  0   Term  0   Preterm  0   AB  0   Living  0     SAB  0   IAB  0   Ectopic  0   Multiple  0   Live Births  0           Social History: Social History   Socioeconomic History   Marital status: Single    Spouse name: Not on file   Number of children: Not on file   Years of education: Not on file   Highest education level: Not on file  Occupational History   Occupation: college freshman   Tobacco Use   Smoking status: Former Smoker    Types: E-cigarettes   Smokeless tobacco: Never Used  Berna Bue Use: Never used  Substance and Sexual Activity   Alcohol use: No   Drug use: No   Sexual activity: Yes    Birth control/protection: None  Other Topics Concern   Not on file  Social History Narrative   Not on file   Social Determinants of Health   Financial Resource Strain: Low Risk    Difficulty of Paying Living Expenses: Not very hard  Food Insecurity: Food Insecurity  Present   Worried About Running Out of Food in the Last Year: Never true   Ran Out of Food in the Last Year: Sometimes true  Transportation Needs: No Transportation Needs   Lack of Transportation (Medical): No   Lack of Transportation (Non-Medical): No  Physical Activity: Insufficiently Active   Days of Exercise per Week: 2 days   Minutes of Exercise per Session: 30 min  Stress: No Stress Concern Present   Feeling of Stress : Not at all  Social Connections: Socially Isolated   Frequency of Communication with Friends and Family: More than three times a week   Frequency of Social Gatherings with Friends and Family: Twice a week   Attends Religious Services: Never   Building services engineer or Organizations: No   Attends Database administrator: Never   Marital Status: Never married    Family History: Family History  Problem Relation Age of Onset   Heart attack Paternal Grandfather    Asthma Paternal Grandmother    Asthma Father     Allergies: Allergies  Allergen Reactions   Rocephin [Ceftriaxone Sodium In Dextrose] Swelling    Pt had eye swelling     Medications Prior to Admission  Medication Sig Dispense Refill  Last Dose   Blood Pressure Monitor MISC For regular home bp monitoring during pregnancy 1 each 0 02/10/2020 at Unknown time   Doxylamine-Pyridoxine (DICLEGIS) 10-10 MG TBEC 2 tabs q hs, if sx persist add 1 tab q am on day 3, if sx persist add 1 tab q afternoon on day 4 100 tablet 6 Past Week at Unknown time   Prenatal Vit-Fe Fumarate-FA (PRENATAL VITAMIN PO) Take by mouth.   02/10/2020 at Unknown time     Review of Systems:  All systems reviewed and negative except as stated in HPI  PE: Blood pressure (!) 131/97, pulse (!) 102, temperature 98.4 F (36.9 C), temperature source Oral, resp. rate 18, height 5\' 5"  (1.651 m), weight 89.2 kg, last menstrual period 05/20/2019, SpO2 98 %. General appearance: alert, cooperative and no  distress Lungs: regular rate and effort Heart: regular rate  Abdomen: soft, non-tender Extremities: Homans sign is negative, no sign of DVT Presentation: cephalic EFM: 140 bpm, mod variability, + accels, no decels Toco: none SVE: 1/70/-1  Prenatal labs: ABO, Rh: --/--/A POS (01/21 1612) Antibody: NEG (01/21 1612) Rubella: 7.01 (07/22 1531) RPR: Non Reactive (11/05 0911)  HBsAg: Negative (07/22 1531)  HIV: Non Reactive (11/05 0911)  GBS: Positive/-- (01/13 1347)  2 hr GTT normal  Prenatal Transfer Tool  Maternal Diabetes: No Genetic Screening: Normal Maternal Ultrasounds/Referrals: Isolated choroid plexus cyst Fetal Ultrasounds or other Referrals:  None Maternal Substance Abuse:  No Significant Maternal Medications:  None Significant Maternal Lab Results: Group B Strep positive  Results for orders placed or performed during the hospital encounter of 02/11/20 (from the past 24 hour(s))  CBC with Differential/Platelet   Collection Time: 02/11/20  2:05 PM  Result Value Ref Range   WBC 14.0 (H) 4.0 - 10.5 K/uL   RBC 4.30 3.87 - 5.11 MIL/uL   Hemoglobin 13.0 12.0 - 15.0 g/dL   HCT 02/13/20 23.5 - 57.3 %   MCV 91.9 80.0 - 100.0 fL   MCH 30.2 26.0 - 34.0 pg   MCHC 32.9 30.0 - 36.0 g/dL   RDW 22.0 25.4 - 27.0 %   Platelets 190 150 - 400 K/uL   nRBC 0.0 0.0 - 0.2 %   Neutrophils Relative % 72 %   Neutro Abs 10.1 (H) 1.7 - 7.7 K/uL   Lymphocytes Relative 19 %   Lymphs Abs 2.7 0.7 - 4.0 K/uL   Monocytes Relative 6 %   Monocytes Absolute 0.8 0.1 - 1.0 K/uL   Eosinophils Relative 1 %   Eosinophils Absolute 0.1 0.0 - 0.5 K/uL   Basophils Relative 0 %   Basophils Absolute 0.0 0.0 - 0.1 K/uL   Immature Granulocytes 2 %   Abs Immature Granulocytes 0.27 (H) 0.00 - 0.07 K/uL  Comprehensive metabolic panel   Collection Time: 02/11/20  2:05 PM  Result Value Ref Range   Sodium 136 135 - 145 mmol/L   Potassium 3.9 3.5 - 5.1 mmol/L   Chloride 103 98 - 111 mmol/L   CO2 22 22 - 32  mmol/L   Glucose, Bld 111 (H) 70 - 99 mg/dL   BUN 5 (L) 6 - 20 mg/dL   Creatinine, Ser 02/13/20 0.44 - 1.00 mg/dL   Calcium 9.0 8.9 - 7.62 mg/dL   Total Protein 6.0 (L) 6.5 - 8.1 g/dL   Albumin 3.0 (L) 3.5 - 5.0 g/dL   AST 15 15 - 41 U/L   ALT 14 0 - 44 U/L   Alkaline Phosphatase 132 (H) 38 -  126 U/L   Total Bilirubin 0.6 0.3 - 1.2 mg/dL   GFR, Estimated >35 >45 mL/min   Anion gap 11 5 - 15  Protein / creatinine ratio, urine   Collection Time: 02/11/20  2:13 PM  Result Value Ref Range   Creatinine, Urine 40.28 mg/dL   Total Protein, Urine <6 mg/dL   Protein Creatinine Ratio        0.00 - 0.15 mg/mg[Cre]  Urinalysis, Routine w reflex microscopic Urine, Clean Catch   Collection Time: 02/11/20  2:13 PM  Result Value Ref Range   Color, Urine STRAW (A) YELLOW   APPearance CLEAR CLEAR   Specific Gravity, Urine 1.005 1.005 - 1.030   pH 7.0 5.0 - 8.0   Glucose, UA NEGATIVE NEGATIVE mg/dL   Hgb urine dipstick NEGATIVE NEGATIVE   Bilirubin Urine NEGATIVE NEGATIVE   Ketones, ur NEGATIVE NEGATIVE mg/dL   Protein, ur NEGATIVE NEGATIVE mg/dL   Nitrite NEGATIVE NEGATIVE   Leukocytes,Ua NEGATIVE NEGATIVE  Type and screen [facility]   Collection Time: 02/11/20  4:12 PM  Result Value Ref Range   ABO/RH(D) A POS    Antibody Screen NEG    Sample Expiration      02/14/2020,2359 Performed at [facility], [address], [place], [place] [address]   SARS Coronavirus 2 by RT PCR (hospital order, performed in [facility] hospital lab) Nasopharyngeal Nasopharyngeal Swab   Collection Time: 02/11/20  4:13 PM   Specimen: Nasopharyngeal Swab  Result Value Ref Range   SARS Coronavirus 2 NEGATIVE NEGATIVE  Results for orders placed or performed in visit on 02/11/20 (from the past 24 hour(s))  POC Urinalysis Dipstick OB   Collection Time: 02/11/20 11:21 AM  Result Value Ref Range   Color, UA     Clarity, UA     Glucose, UA Negative Negative   Bilirubin, UA     Ketones, UA  neg    Spec Grav, UA     Blood, UA neg    pH, UA     POC,PROTEIN,UA Negative Negative, Trace, Small (1+), Moderate (2+), Large (3+), 4+   Urobilinogen, UA     Nitrite, UA neg    Leukocytes, UA Negative Negative   Appearance     Odor      Patient Active Problem List   Diagnosis Date Noted   Gestational hypertension 02/11/2020   Supervision of normal first pregnancy 08/12/2019   Acute appendicitis 11/09/2018    Assessment: SHEVETTE BESS is a 20 y.o. G1P0000 at [redacted]w[redacted]d here for IOL for gHTN  1. Labor: latent 2. FWB: Cat I 3. Pain: analgesia/anesthesia prn 4. GBS: pos 5. gHTN: labs neg   Plan: Admit to LD PCN Cervical ripening w/Cytotec and FB Anticipate SVD  Donette Larry, CNM  02/11/2020, 6:43 PM

## 2020-02-11 NOTE — Progress Notes (Signed)
Labor Progress Note Stephanie Molina is a 20 y.o. G1P0000 at [redacted]w[redacted]d presented for IOL for gHTN  S:  C/o frontal HA that started when she arrived to LD. Denies visual disturbances, RUQ pain, SOB, and CP.  O:  BP (!) 131/97 (BP Location: Right Arm)    Pulse (!) 102    Temp 98.4 F (36.9 C) (Oral)    Resp 18    Ht 5\' 5"  (1.651 m)    Wt 89.2 kg    LMP 05/20/2019    SpO2 98%    BMI 32.72 kg/m  EFM: baseline 140 bpm/ mod variability/ + accels/ no decels  Toco/IUPC: none SVE:  1/70/-1, vtx  A/P: 20 y.o. G1P0000 [redacted]w[redacted]d  1. Labor: latent 2. FWB: Cat I 3. Pain: analgesia/anesthesia prn 4. GHTN: stable  Will treat HA and have patient eat, if not improved may need to start Mg for SF. Consented for FB, placed w/o difficulty, tolerated well. S/p Cytotec. Pitocin when appropriate. Anticipate labor progress and SVD.  [redacted]w[redacted]d, CNM 6:38 PM

## 2020-02-12 ENCOUNTER — Inpatient Hospital Stay (HOSPITAL_COMMUNITY): Payer: Medicaid Other | Admitting: Anesthesiology

## 2020-02-12 LAB — CBC
HCT: 33.5 % — ABNORMAL LOW (ref 36.0–46.0)
Hemoglobin: 11.5 g/dL — ABNORMAL LOW (ref 12.0–15.0)
MCH: 31.8 pg (ref 26.0–34.0)
MCHC: 34.3 g/dL (ref 30.0–36.0)
MCV: 92.5 fL (ref 80.0–100.0)
Platelets: 169 10*3/uL (ref 150–400)
RBC: 3.62 MIL/uL — ABNORMAL LOW (ref 3.87–5.11)
RDW: 13.1 % (ref 11.5–15.5)
WBC: 14 10*3/uL — ABNORMAL HIGH (ref 4.0–10.5)
nRBC: 0 % (ref 0.0–0.2)

## 2020-02-12 LAB — RPR: RPR Ser Ql: NONREACTIVE

## 2020-02-12 MED ORDER — EPHEDRINE 5 MG/ML INJ: 10.0000 mg | INTRAVENOUS | Status: DC | PRN

## 2020-02-12 MED ORDER — DIPHENHYDRAMINE HCL 50 MG/ML IJ SOLN: 12.5000 mg | INTRAMUSCULAR | Status: DC | PRN

## 2020-02-12 MED ORDER — FENTANYL-BUPIVACAINE-NACL 0.5-0.125-0.9 MG/250ML-% EP SOLN
12.0000 mL/h | EPIDURAL | Status: DC | PRN
  Filled 2020-02-12: qty 250

## 2020-02-12 MED ORDER — PHENYLEPHRINE 40 MCG/ML (10ML) SYRINGE FOR IV PUSH (FOR BLOOD PRESSURE SUPPORT): 80.0000 ug | PREFILLED_SYRINGE | INTRAVENOUS | Status: DC | PRN

## 2020-02-12 MED ORDER — LACTATED RINGERS IV SOLN: 500.0000 mL | Freq: Once | INTRAVENOUS | Status: DC

## 2020-02-12 NOTE — Progress Notes (Signed)
Labor Progress Note Stephanie Molina is a 20 y.o. G1P0000 at [redacted]w[redacted]d presented for IOL for gHTN  S:  Patient states she is feeling more uncomfortable and cramping since last cytotec placement  O:  BP 135/86   Pulse (!) 105   Temp 97.9 F (36.6 C) (Oral)   Resp 16   Ht 5\' 5"  (1.651 m)   Wt 89.2 kg   LMP 05/20/2019   SpO2 98%   BMI 32.72 kg/m   Fetal Tracing:  Baseline: 140 Variability: moderate Accels: 15x15 Decels: none  Toco: occasional uc's   CVE: Dilation: 5 Effacement (%): 80 Cervical Position: Posterior Station: -1 Presentation: Vertex Exam by:: Stephanie Molina, cnm   A&P: 89 y.o. G1P0000 [redacted]w[redacted]d IOL gHTN #Labor: Progressing well. Will restart cytotec after patient eats dinner #Pain: per patient request #FWB: Cat 1 #GBS positive   [redacted]w[redacted]d, CNM 5:46 PM

## 2020-02-12 NOTE — Progress Notes (Signed)
Labor Progress Note Stephanie Molina is a 19 y.o. G1P0000 at [redacted]w[redacted]d presented for IOL for gHTN.  S: Pt reports mild discomfort with contractions, rated 4/10. No HA, vision changes or other concerns at this time. Pt desires epidural s/p AROM.  O:  BP 123/74   Pulse 90   Temp 97.9 F (36.6 C) (Oral)   Resp 18   Ht 5\' 5"  (1.651 m)   Wt 89.2 kg   LMP 05/20/2019   SpO2 98%   BMI 32.72 kg/m  FHT: Baseline: 140 Variability: moderate Accels: multiple Decels: none  Toco: ctx q2 min   CVE: Dilation: 5 Effacement (%): 80 Cervical Position: Posterior Station: 0 Presentation: Vertex Exam by:: Dr. [email or web address]   A&P: 20 y.o. G1P0000 [redacted]w[redacted]d IOL gHTN. #Labor: S/p cytotec+FB. Initially transitioned to pitocin at 0100 on 1/22. Given minimal cervical change, switched back to cytotec x2. Pitocin ultimately restarted at 1820. Given lack of cervical change, AROM for clear fluid at 2300. Placed IUPC to facilitate up-titration of pitocin. Will plan for recheck in 4 hours or sooner as clinically indicated. #Pain: plan for epidural at this time. #FWB: Cat 1 strip #GBS positive  #gHTN: blood pressures most recently normal range. Asymptomatic. Normal preeclampsia labs on admission. Will continue to monitor.  2/22, MD 11:13 PM

## 2020-02-12 NOTE — Progress Notes (Signed)
Labor Progress Note Stephanie Molina is a 20 y.o. G1P0000 at [redacted]w[redacted]d presented for IOL gestational hypertension  S:  Patient sleeping  O:  BP (!) 97/44   Pulse 79   Temp 97.8 F (36.6 C) (Oral)   Resp 16   Ht 5\' 5"  (1.651 m)   Wt 89.2 kg   LMP 05/20/2019   SpO2 98%   BMI 32.72 kg/m   Fetal Tracing:  Baseline: 125 Variability: moderate Accels: 15x15 Decels: none  Toco: 2-6   CVE: Dilation: 4 Effacement (%): 50 Cervical Position: Posterior Station: -1 Presentation: Vertex Exam by:: Chino Sardo, cnm   A&P: 20 y.o. G1P0000 [redacted]w[redacted]d IOL gHTN #Labor: Cervix unchanged despite 57mu/min of pitocin and patient sleeping. Will d/c pitocin and give another buccal cytotec. Will recheck in 4 hours.  #Pain: per patient request #FWB: Cat 1 #GBS positive  30m, CNM 8:59 AM

## 2020-02-12 NOTE — Progress Notes (Signed)
Stephanie Molina is a 20 y.o. G1P0000 at [redacted]w[redacted]d by LMP admitted for induction of labor due to gHTN.  Subjective: Patient resting well. Pain well-managed with IV pain medications. Planning an epidural.  Objective: BP 123/77   Pulse 99   Temp 97.6 F (36.4 C) (Oral)   Resp 16   Ht 5\' 5"  (1.651 m)   Wt 89.2 kg   LMP 05/20/2019   SpO2 98%   BMI 32.72 kg/m  No intake/output data recorded. No intake/output data recorded.  FHT:  FHR: 125 bpm, variability: moderate,  accelerations:  Present,  decelerations:  Absent UC:   regular, every 2-4 minutes SVE:   Dilation: 4.5 Effacement (%): 70 Station: -2 Exam by:: R Dawson CNM Pitocin 10 milli-units  Labs: Lab Results  Component Value Date   WBC 14.0 (H) 02/11/2020   HGB 13.0 02/11/2020   HCT 39.5 02/11/2020   MCV 91.9 02/11/2020   PLT 190 02/11/2020    Assessment / Plan: Induction of labor due to gestational hypertension,  progressing well on pitocin  Labor: Progressing on Pitocin, will continue to increase then AROM Preeclampsia:  labs stable Fetal Wellbeing:  Category I Pain Control:  Labor support without medications I/D:  n/a Anticipated MOD:  NSVD  02/13/2020, CNM 02/12/2020, 5:00 AM

## 2020-02-12 NOTE — Progress Notes (Signed)
Labor Progress Note Stephanie Molina is a 20 y.o. G1P0000 at [redacted]w[redacted]d presented for IOL gHTN  S:  Patient sleeping  O:  BP 140/79   Pulse 92   Temp 98 F (36.7 C) (Oral)   Resp 16   Ht 5\' 5"  (1.651 m)   Wt 89.2 kg   LMP 05/20/2019   SpO2 98%   BMI 32.72 kg/m   Fetal Tracing:  Baseline: 130 Variability: moderate Accels: 15x15 Decels: none  Toco: occasional uc's   CVE: Dilation: 4 Effacement (%): 50 Cervical Position: Posterior Station: -1 Presentation: Vertex Exam by:: Shateka Petrea, cnm   A&P: 20 y.o. G1P0000 [redacted]w[redacted]d IOL gHTN #Labor: Cervix thinner but essentially the same. Will continue cytotec. #Pain: per patient request #FWB: Cat 1 #GBS positive  [redacted]w[redacted]d, CNM 1:07 PM

## 2020-02-12 NOTE — Progress Notes (Signed)
Stephanie Molina is a 20 y.o. G1P0000 at [redacted]w[redacted]d by LMP admitted for induction of labor due to gHTN.  Subjective: Cervical balloon out. Time for cervix to be checked as well. Patient resting somewhat after a dose of Fentanyl. Patient's mother and FOB at the bedside for support.  Objective: BP 115/84   Pulse 82   Temp (!) 97.3 F (36.3 C) (Axillary)   Resp 16   Ht 5\' 5"  (1.651 m)   Wt 89.2 kg   LMP 05/20/2019   SpO2 98%   BMI 32.72 kg/m  No intake/output data recorded. No intake/output data recorded.  FHT:  FHR: 130 bpm, variability: moderate,  accelerations:  Present,  decelerations:  Absent UC:   regular, every 2-4 minutes SVE:   Dilation: 4.5 Effacement (%): 50 Station: -3 Exam by:: R.Kashus Karlen, CNM Unsure of presentation d/t small parts palpable with cervical exam --> see BS U/S documentation  Bedside U/S:  Patient informed that the ultrasound is considered a limited OB ultrasound and is not intended to be a complete ultrasound exam.  Patient also informed that the ultrasound is not being completed with the intent of assessing for fetal or placental anomalies or any pelvic abnormalities.  Explained that the purpose of today's ultrasound is to assess for presentation.  Baby was found to be in a cephalic presentation with a compound hand; which explains what was felt during cervical exam. Patient acknowledges the purpose of the exam and the limitations of the study.    Labs: Lab Results  Component Value Date   WBC 14.0 (H) 02/11/2020   HGB 13.0 02/11/2020   HCT 39.5 02/11/2020   MCV 91.9 02/11/2020   PLT 190 02/11/2020    Assessment / Plan: Induction of labor due to gestational hypertension  Labor: Progressing normally Preeclampsia:  labs stable Fetal Wellbeing:  Category I Pain Control:  IV pain meds I/D:  n/a Anticipated MOD:  NSVD  02/13/2020, CNM 02/12/2020, 1:03 AM

## 2020-02-13 ENCOUNTER — Encounter (HOSPITAL_COMMUNITY): Payer: Self-pay | Admitting: Family Medicine

## 2020-02-13 DIAGNOSIS — Z3A38 38 weeks gestation of pregnancy: Secondary | ICD-10-CM

## 2020-02-13 DIAGNOSIS — O139 Gestational [pregnancy-induced] hypertension without significant proteinuria, unspecified trimester: Secondary | ICD-10-CM

## 2020-02-13 DIAGNOSIS — O326XX0 Maternal care for compound presentation, not applicable or unspecified: Secondary | ICD-10-CM

## 2020-02-13 DIAGNOSIS — O99824 Streptococcus B carrier state complicating childbirth: Secondary | ICD-10-CM

## 2020-02-13 MED ORDER — LIDOCAINE HCL (PF) 1 % IJ SOLN
INTRAMUSCULAR | Status: DC | PRN
  Administered 2020-02-12: 3 mL via EPIDURAL
  Administered 2020-02-12: 5 mL via EPIDURAL
  Administered 2020-02-12: 2 mL via EPIDURAL

## 2020-02-13 MED ORDER — SENNOSIDES-DOCUSATE SODIUM 8.6-50 MG PO TABS
2.0000 | ORAL_TABLET | ORAL | Status: DC
  Administered 2020-02-13 – 2020-02-14 (×2): 2 via ORAL
  Filled 2020-02-13 (×2): qty 2

## 2020-02-13 MED ORDER — BENZOCAINE-MENTHOL 20-0.5 % EX AERO
1.0000 "application " | INHALATION_SPRAY | CUTANEOUS | Status: DC | PRN
  Administered 2020-02-13: 1 via TOPICAL
  Filled 2020-02-13: qty 56

## 2020-02-13 MED ORDER — IBUPROFEN 600 MG PO TABS
600.0000 mg | ORAL_TABLET | Freq: Four times a day (QID) | ORAL | Status: DC
  Administered 2020-02-13 – 2020-02-15 (×9): 600 mg via ORAL
  Filled 2020-02-13 (×9): qty 1

## 2020-02-13 MED ORDER — DIBUCAINE (PERIANAL) 1 % EX OINT: 1.0000 "application " | TOPICAL_OINTMENT | CUTANEOUS | Status: DC | PRN

## 2020-02-13 MED ORDER — ACETAMINOPHEN 325 MG PO TABS: 650.0000 mg | ORAL_TABLET | ORAL | Status: DC | PRN

## 2020-02-13 MED ORDER — SIMETHICONE 80 MG PO CHEW: 80.0000 mg | CHEWABLE_TABLET | ORAL | Status: DC | PRN

## 2020-02-13 MED ORDER — INFLUENZA VAC SPLIT QUAD 0.5 ML IM SUSY
0.5000 mL | PREFILLED_SYRINGE | INTRAMUSCULAR | Status: AC
  Administered 2020-02-14: 0.5 mL via INTRAMUSCULAR
  Filled 2020-02-13: qty 0.5

## 2020-02-13 MED ORDER — TETANUS-DIPHTH-ACELL PERTUSSIS 5-2.5-18.5 LF-MCG/0.5 IM SUSY: 0.5000 mL | PREFILLED_SYRINGE | Freq: Once | INTRAMUSCULAR | Status: DC

## 2020-02-13 MED ORDER — LIDOCAINE-EPINEPHRINE (PF) 2 %-1:200000 IJ SOLN
INTRAMUSCULAR | Status: DC | PRN
  Administered 2020-02-13: 5 mL via EPIDURAL

## 2020-02-13 MED ORDER — LACTATED RINGERS IV SOLN: 500.0000 mL | Freq: Once | INTRAVENOUS | Status: DC

## 2020-02-13 MED ORDER — DIPHENHYDRAMINE HCL 25 MG PO CAPS
25.0000 mg | ORAL_CAPSULE | Freq: Four times a day (QID) | ORAL | Status: DC | PRN
Start: 2020-02-13 — End: 2020-02-15

## 2020-02-13 MED ORDER — COCONUT OIL OIL: 1.0000 "application " | TOPICAL_OIL | Status: DC | PRN

## 2020-02-13 MED ORDER — WITCH HAZEL-GLYCERIN EX PADS: 1.0000 "application " | MEDICATED_PAD | CUTANEOUS | Status: DC | PRN

## 2020-02-13 MED ORDER — ONDANSETRON HCL 4 MG PO TABS: 4.0000 mg | ORAL_TABLET | ORAL | Status: DC | PRN

## 2020-02-13 MED ORDER — PRENATAL MULTIVITAMIN CH
1.0000 | ORAL_TABLET | Freq: Every day | ORAL | Status: DC
  Administered 2020-02-13 – 2020-02-15 (×3): 1 via ORAL
  Filled 2020-02-13 (×3): qty 1

## 2020-02-13 MED ORDER — ZOLPIDEM TARTRATE 5 MG PO TABS: 5.0000 mg | ORAL_TABLET | Freq: Every evening | ORAL | Status: DC | PRN

## 2020-02-13 MED ORDER — SODIUM CHLORIDE (PF) 0.9 % IJ SOLN
INTRAMUSCULAR | Status: DC | PRN
  Administered 2020-02-12: 12 mL/h via EPIDURAL

## 2020-02-13 MED ORDER — ONDANSETRON HCL 4 MG/2ML IJ SOLN: 4.0000 mg | INTRAMUSCULAR | Status: DC | PRN

## 2020-02-13 NOTE — Lactation Note (Signed)
This note was copied from a baby's chart. Lactation Consultation Note  Patient Name: Stephanie Molina ACZYS'A Date: 02/13/2020 Reason for consult: Follow-up assessment;Early term 37-38.6wks;Other (Comment);Primapara;1st time breastfeeding (teen pregnancy) Age:20 hours  Visited with mom of 10 hours old baby, LC order was put it tonight. Mom is a P1 and reported (+) breast changes during the pregnancy, she's 20 y.o. She participated in the [facility] program at the [facility] and she's already familiar with hand expression. When [facility] revised hand expression with mom she was able to get colostrum out of her right breast, praised her for her efforts.  Mom trying to feed baby when entered the room, noticed that baby had poor positioning, with arm tucked in and blankets in between mom and baby. LC helped with repositioning baby and after a few tried he was able to grab a big section of the nipple/areola complex but only had a few sucks on and off for about 5 minutes. Mom told LC that baby has been trying to feed for the last 45 minutes, LC showed mom how to break the latch and stopped this feeding because baby had already stopped sucking.  Asked mom to call for assistance when needed. Reviewed normal newborn behavior, cluster feeding, feeding cues, size of baby's stomach and lactogenesis II. Mom will try to limit attempts to 20-30 minutes at a time at the most, explained to parents that babies sometimes can get overtired when trying to feed for too long.   Feeding plan:  1. Encouraged mom to feed baby STS 8-12 times/24 hours or sooner if feeding cues are present 2. Hand expression and finger/spoon feeding were also encouraged  BF brochure, BF resources and feeding diary were reviewed. FOB and GOB were mom's support people at the time of [facility] consultation. Family reported all questions and concerns were answered, they're both aware of LC OP services and will call PRN.   Maternal Data Formula Feeding for  Exclusion: No Has patient been taught Hand Expression?: Yes Does the patient have breastfeeding experience prior to this delivery?: No  Feeding Feeding Type: Breast Fed  LATCH Score Latch: Repeated attempts needed to sustain latch, nipple held in mouth throughout feeding, stimulation needed to elicit sucking reflex. (baby falling asleep after a 45 minutes attempt prior LC entered the room)  Audible Swallowing: A few with stimulation  Type of Nipple: Everted at rest and after stimulation  Comfort (Breast/Nipple): Soft / non-tender  Hold (Positioning): Assistance needed to correctly position infant at breast and maintain latch.  LATCH Score: 7  Interventions Interventions: Breast feeding basics reviewed;Assisted with latch;Skin to skin;Breast massage;Hand express;Breast compression;Adjust position  Lactation Tools Discussed/Used WIC Program: Yes   Consult Status Consult Status: Follow-up Date: 02/14/20 Follow-up type: In-patient    Leean Amezcua Venetia Constable 02/13/2020, 7:53 PM

## 2020-02-13 NOTE — Anesthesia Procedure Notes (Signed)
Epidural Patient location during procedure: OB Start time: 02/12/2020 11:24 PM End time: 02/12/2020 11:31 PM  Staffing Anesthesiologist: Cecile Hearing, MD Performed: anesthesiologist   Preanesthetic Checklist Completed: patient identified, IV checked, risks and benefits discussed, monitors and equipment checked, pre-op evaluation and timeout performed  Epidural Patient position: sitting Prep: DuraPrep Patient monitoring: blood pressure and continuous pulse ox Approach: midline Location: L3-L4 Injection technique: LOR air  Needle:  Needle type: Tuohy  Needle gauge: 17 G Needle length: 9 cm Needle insertion depth: 5 cm Catheter size: 19 Gauge Catheter at skin depth: 10 cm Test dose: negative and Other (1% Lidocaine)  Additional Notes Patient identified.  Risk benefits discussed including failed block, incomplete pain control, headache, nerve damage, paralysis, blood pressure changes, nausea, vomiting, reactions to medication both toxic or allergic, and postpartum back pain.  Patient expressed understanding and wished to proceed.  All questions were answered.  Sterile technique used throughout procedure and epidural site dressed with sterile barrier dressing. No paresthesia or other complications noted. The patient did not experience any signs of intravascular injection such as tinnitus or metallic taste in mouth nor signs of intrathecal spread such as rapid motor block. Please see nursing notes for vital signs. Reason for block:procedure for pain

## 2020-02-13 NOTE — Discharge Instructions (Signed)

## 2020-02-13 NOTE — Progress Notes (Signed)
Labor Progress Note Stephanie Molina is a 20 y.o. G1P0000 at [redacted]w[redacted]d presented for IOL for gHTN.  S: Pt resting comfortably with epidural in place. No HA, vision changes or other concerns at this time.  O:  BP (!) 102/59   Pulse 74   Temp 97.9 F (36.6 C) (Oral)   Resp 18   Ht 5\' 5"  (1.651 m)   Wt 89.2 kg   LMP 05/20/2019   SpO2 98%   BMI 32.72 kg/m  FHT: Baseline: 130 Variability: moderate Accels: multiple Decels: none  Toco: ctx q1-2 min, 190 MVU    CVE: Dilation: 5.5 Effacement (%): 80 Cervical Position: Posterior Station: 0 Presentation: Vertex Exam by:: Dr. [email or web address]   A&P: 20 y.o. G1P0000 [redacted]w[redacted]d IOL secondary to gHTN. #Labor: S/p cytotec+FB. Initially transitioned to pitocin at 0100 on 1/22. Given minimal cervical change, switched back to cytotec x2. Pitocin ultimately restarted at 1820. Given lack of cervical change, AROM for clear fluid at 2300. Minimal cervical change since last exam. Of note, contractions not yet adequate. Will plan to up-titrate pitocin with repeat cervical exam in 4 hours or sooner as clinically indicated. #Pain: epidural in place #FWB: Cat 1 strip #GBS positive; now s/p adequate antibiotics #gHTN: blood pressures most recently normal range. Asymptomatic. Normal preeclampsia labs on admission. Will continue to monitor.  2/22, MD 3:11 AM

## 2020-02-13 NOTE — Anesthesia Preprocedure Evaluation (Signed)
Anesthesia Evaluation  Patient identified by MRN, date of birth, ID band Patient awake    Reviewed: Allergy & Precautions, NPO status , Patient's Chart, lab work & pertinent test results  Airway Mallampati: II  TM Distance: >3 FB Neck ROM: Full    Dental  (+) Teeth Intact, Dental Advisory Given   Pulmonary former smoker,    Pulmonary exam normal breath sounds clear to auscultation       Cardiovascular negative cardio ROS Normal cardiovascular exam Rhythm:Regular Rate:Normal     Neuro/Psych negative neurological ROS     GI/Hepatic negative GI ROS, Neg liver ROS,   Endo/Other  negative endocrine ROSObesity   Renal/GU negative Renal ROS     Musculoskeletal negative musculoskeletal ROS (+)   Abdominal   Peds  Hematology  (+) Blood dyscrasia, anemia , Plt 169k   Anesthesia Other Findings Day of surgery medications reviewed with the patient.  Reproductive/Obstetrics (+) Pregnancy                             Anesthesia Physical Anesthesia Plan  ASA: II  Anesthesia Plan: Epidural   Post-op Pain Management:    Induction:   PONV Risk Score and Plan: 2 and Treatment may vary due to age or medical condition  Airway Management Planned: Natural Airway  Additional Equipment:   Intra-op Plan:   Post-operative Plan:   Informed Consent: I have reviewed the patients History and Physical, chart, labs and discussed the procedure including the risks, benefits and alternatives for the proposed anesthesia with the patient or authorized representative who has indicated his/her understanding and acceptance.     Dental advisory given  Plan Discussed with:   Anesthesia Plan Comments: (Patient identified. Risks/Benefits/Options discussed with patient including but not limited to bleeding, infection, nerve damage, paralysis, failed block, incomplete pain control, headache, blood pressure changes,  nausea, vomiting, reactions to medication both or allergic, itching and postpartum back pain. Confirmed with bedside nurse the patient's most recent platelet count. Confirmed with patient that they are not currently taking any anticoagulation, have any bleeding history or any family history of bleeding disorders. Patient expressed understanding and wished to proceed. All questions were answered. )        Anesthesia Quick Evaluation

## 2020-02-13 NOTE — Lactation Note (Signed)
This note was copied from a baby's chart. Lactation Consultation Note  Patient Name: Stephanie Molina [id number]'Q Date: 02/13/2020 Reason for consult: L&D Initial assessment Age:20 hours  P1, Baby on and off latching as LC entered room.  RN recently assisted with latch. Encouraged mother to support her breast and compress to achieve a deep latch. Intermittent sucks and swallows observed and then baby would come off breat and eagerly come back on breast. Discussed feeding with cues 8-12 times per day.      Feeding Feeding Type: Breast Fed  LATCH Score Latch: Repeated attempts needed to sustain latch, nipple held in mouth throughout feeding, stimulation needed to elicit sucking reflex.  Audible Swallowing: A few with stimulation  Type of Nipple: Everted at rest and after stimulation  Comfort (Breast/Nipple): Soft / non-tender  Hold (Positioning): Assistance needed to correctly position infant at breast and maintain latch.  LATCH Score: 7  Interventions Interventions: Breast feeding basics reviewed;Assisted with latch;Skin to skin;Breast compression;Support pillows (coming off and on breast)  Lactation Tools Discussed/Used     Consult Status Consult Status: Follow-up Date: 02/13/20 Follow-up type: In-patient    Dahlia Byes [facility] 02/13/2020, 10:54 AM

## 2020-02-13 NOTE — Discharge Summary (Signed)
Postpartum Discharge Summary     Patient Name: Stephanie Molina DOB: 2000/11/30 MRN: [id number]  Date of admission: 02/11/2020 Delivery date:02/13/2020  Delivering provider: Gladys Damme  Date of discharge: 02/15/2020  Admitting diagnosis: Gestational hypertension [O13.9] Intrauterine pregnancy: [redacted]w[redacted]d    Secondary diagnosis:  Active Problems:   Supervision of normal first pregnancy   Gestational hypertension   Vaginal delivery  Additional problems: none    Discharge diagnosis: Term Pregnancy Delivered and Gestational Hypertension                                              Post partum procedures: none Augmentation: AROM, Pitocin, Cytotec and IP Foley Complications: None  Hospital course: Induction of Labor With Vaginal Delivery   20y.o. yo G1P0000 at 201w3das admitted to the hospital 02/11/2020 for induction of labor.  Indication for induction: Gestational hypertension.  Patient had an uncomplicated labor course as follows: Membrane Rupture Time/Date: 10:55 PM ,02/12/2020   Delivery Method:Vaginal, Spontaneous  Episiotomy: None  Lacerations:  1st degree  Details of delivery can be found in separate delivery note.  Patient had a routine postpartum course. Patient is discharged home 02/15/20.  Newborn Data: Birth date:02/13/2020  Birth time:9:48 AM  Gender:Female  Living status:Living  Apgars:8 ,9  Weight:3226 g   Magnesium Sulfate received: No BMZ received: No Rhophylac:N/A MMR:N/A T-DaP:Given prenatally Flu: No Transfusion:No  Physical exam  Vitals:   02/14/20 0618 02/14/20 1447 02/14/20 1935 02/15/20 0527  BP: 110/69 120/75 125/86 121/77  Pulse: 72 80 95 75  Resp: '18 18 16 16  ' Temp: 98.2 F (36.8 C) 98.1 F (36.7 C) 98 F (36.7 C) (!) 97.5 F (36.4 C)  TempSrc: Oral Oral Oral Oral  SpO2: 100%  98% 99%  Weight:      Height:       General: alert, cooperative and no distress Lochia: appropriate Uterine Fundus: firm Incision: N/A DVT Evaluation: No  evidence of DVT seen on physical exam. Negative Homan's sign. No cords or calf tenderness. Labs: Lab Results  Component Value Date   WBC 14.0 (H) 02/12/2020   HGB 11.5 (L) 02/12/2020   HCT 33.5 (L) 02/12/2020   MCV 92.5 02/12/2020   PLT 169 02/12/2020   CMP Latest Ref Rng & Units 02/11/2020  Glucose 70 - 99 mg/dL 111(H)  BUN 6 - 20 mg/dL 5(L)  Creatinine 0.44 - 1.00 mg/dL 0.64  Sodium 135 - 145 mmol/L 136  Potassium 3.5 - 5.1 mmol/L 3.9  Chloride 98 - 111 mmol/L 103  CO2 22 - 32 mmol/L 22  Calcium 8.9 - 10.3 mg/dL 9.0  Total Protein 6.5 - 8.1 g/dL 6.0(L)  Total Bilirubin 0.3 - 1.2 mg/dL 0.6  Alkaline Phos 38 - 126 U/L 132(H)  AST 15 - 41 U/L 15  ALT 0 - 44 U/L 14   Edinburgh Score: Edinburgh Postnatal Depression Scale Screening Tool 02/14/2020  I have been able to laugh and see the funny side of things. 0  I have looked forward with enjoyment to things. 0  I have blamed myself unnecessarily when things went wrong. 1  I have been anxious or worried for no good reason. 1  I have felt scared or panicky for no good reason. 0  Things have been getting on top of me. 1  I have been so unhappy that I have  had difficulty sleeping. 0  I have felt sad or miserable. 1  I have been so unhappy that I have been crying. 1  The thought of harming myself has occurred to me. 0  Edinburgh Postnatal Depression Scale Total 5     After visit meds:  Allergies as of 02/15/2020      Reactions   Rocephin [ceftriaxone Sodium In Dextrose] Swelling   Pt had eye swelling      Medication List    STOP taking these medications   acetaminophen 500 MG tablet Commonly known as: TYLENOL   Blood Pressure Monitor Misc   calcium carbonate 500 MG chewable tablet Commonly known as: TUMS - dosed in mg elemental calcium   Doxylamine-Pyridoxine 10-10 MG Tbec Commonly known as: Diclegis     TAKE these medications   ibuprofen 600 MG tablet Commonly known as: ADVIL Take 1 tablet (600 mg total) by  mouth every 6 (six) hours.   PRENATAL VITAMIN PO Take by mouth.        Discharge home in stable condition Infant Feeding: Breast Infant Disposition:home with mother Discharge instruction: per After Visit Summary and Postpartum booklet. Activity: Advance as tolerated. Pelvic rest for 6 weeks.  Diet: routine diet Future Appointments: Future Appointments  Date Time Provider [place]  02/21/2020 11:10 AM [facility] NURSE [facility]  03/20/2020 11:50 AM Roma Schanz, CNM [facility]   Follow up Visit:  Follow-up Information    [facility] Follow up.   Specialty: Obstetrics and Gynecology Why: 1/31 for Blood pressure check (can be virtual) and 2/28 for postpartum appointment Contact information: [place] [phone]             Message sent to FT by Sylvester Harder 02/13/20.   Please schedule this patient for a In person postpartum visit in 4 weeks with the following provider: Any provider. Additional Postpartum F/U:BP check 1 week  High risk pregnancy complicated by: HTN Delivery mode:  Vaginal, Spontaneous  Anticipated Birth Control:  Condoms   02/15/2020 Christin Fudge, CNM

## 2020-02-14 DIAGNOSIS — O135 Gestational [pregnancy-induced] hypertension without significant proteinuria, complicating the puerperium: Secondary | ICD-10-CM

## 2020-02-14 MED ORDER — ACETAMINOPHEN FOR CIRCUMCISION 160 MG/5 ML: 40.0000 mg | ORAL | Status: DC | PRN

## 2020-02-14 MED ORDER — EPINEPHRINE TOPICAL FOR CIRCUMCISION 0.1 MG/ML: 1.0000 [drp] | TOPICAL | Status: DC | PRN

## 2020-02-14 MED ORDER — SUCROSE 24% NICU/PEDS ORAL SOLUTION: 0.5000 mL | OROMUCOSAL | Status: DC | PRN

## 2020-02-14 MED ORDER — LIDOCAINE 1% INJECTION FOR CIRCUMCISION
0.8000 mL | INJECTION | Freq: Once | INTRAVENOUS | Status: DC
  Filled 2020-02-14: qty 1

## 2020-02-14 MED ORDER — ACETAMINOPHEN 325 MG PO TABS
650.0000 mg | ORAL_TABLET | Freq: Four times a day (QID) | ORAL | Status: DC
  Administered 2020-02-14 – 2020-02-15 (×5): 650 mg via ORAL
  Filled 2020-02-14 (×5): qty 2

## 2020-02-14 MED ORDER — WHITE PETROLATUM EX OINT: 1.0000 "application " | TOPICAL_OINTMENT | CUTANEOUS | Status: DC | PRN

## 2020-02-14 MED ORDER — ACETAMINOPHEN FOR CIRCUMCISION 160 MG/5 ML: 40.0000 mg | Freq: Once | ORAL | Status: DC

## 2020-02-14 NOTE — Lactation Note (Signed)
This note was copied from a baby's chart. Lactation Consultation Note  Patient Name: Stephanie Molina [id number] Date: 02/14/2020 Reason for consult: Follow-up assessment Age:20 hours   LC Follow Up Visit:  Attempted to visit with family, however, everyone asleep when I arrived.  Will return later for a follow up visit; RN updated.   Maternal Data    Feeding Feeding Type: Breast Fed  LATCH Score Latch: Repeated attempts needed to sustain latch, nipple held in mouth throughout feeding, stimulation needed to elicit sucking reflex.  Audible Swallowing: A few with stimulation  Type of Nipple: Everted at rest and after stimulation  Comfort (Breast/Nipple): Soft / non-tender  Hold (Positioning): Assistance needed to correctly position infant at breast and maintain latch.  LATCH Score: 7  Interventions    Lactation Tools Discussed/Used Tools: Pump Breast pump type: Double-Electric Breast Pump Pump Education: Setup, frequency, and cleaning;Milk Storage Initiated by:: Stephanie Molina Date initiated:: 02/14/20   Consult Status Consult Status: Follow-up Date: 02/14/20 Follow-up type: In-patient    Dora Sims 02/14/2020, 10:43 AM

## 2020-02-14 NOTE — Progress Notes (Signed)
POSTPARTUM PROGRESS NOTE  Subjective: Stephanie Molina is a 20 y.o. G1P1001 s/p vaginal delivery at [redacted]w[redacted]d.  She reports she doing well. No acute events overnight. She denies any problems with ambulating, voiding or po intake. Denies nausea or vomiting. She has  passed flatus. Pain is moderately controlled.  Lochia is minimal.  Objective: Blood pressure 110/69, pulse 72, temperature 98.2 F (36.8 C), temperature source Oral, resp. rate 18, height 5\' 5"  (1.651 m), weight 89.2 kg, last menstrual period 05/20/2019, SpO2 100 %, unknown if currently breastfeeding.  Physical Exam:  General: alert, cooperative and no distress Chest: no respiratory distress Abdomen: soft, non-tender  Uterine Fundus: firm and at level of umbilicus Extremities: No calf swelling or tenderness   no LE edema  Recent Labs    02/11/20 1405 02/12/20 2033  HGB 13.0 11.5*  HCT 39.5 33.5*    Assessment/Plan: Stephanie Molina is a 19 y.o. G1P1001 s/p vaginal delivery at [redacted]w[redacted]d.  Routine Postpartum Care: Doing well, pain well-controlled.  -- Continue routine care, lactation support  -- Contraception: desires condoms s/p counseling -- Feeding: breast -- gHTN: blood pressures wnl s/p delivery. Stephanie continue to monitor.  Dispo: Plan for discharge PPD#2.  [redacted]w[redacted]d, MD OB Fellow, [facility] 02/14/2020 9:51 AM

## 2020-02-14 NOTE — Progress Notes (Addendum)
Patient ID: Stephanie Molina, female   DOB: May 22, 2000, 20 y.o.   MRN: [id number] Post Partum Day 1 Subjective: Stephanie Molina 20 yo G1P1 PPD#1 complains of vaginal pain. She reports urinating and passing flatus. She reports some vaginal bleeding but decreased from yesterday. She denies discharge. She reports eating an drinking w/o nausea. She denies abdominal or chest pain. She has been up and walking around. After extensive counseling patient denies contraception and will continue with condom use. She feels she would benefit from another day in the hospital. She was counseled on return precautions, VTE precautions, infection precautions and pre-eclampsia precautions.  Objective: Blood pressure 110/69, pulse 72, temperature 98.2 F (36.8 C), temperature source Oral, resp. rate 18, height 5\' 5"  (1.651 m), weight 89.2 kg, last menstrual period 05/20/2019, SpO2 100 %, unknown if currently breastfeeding.  Physical Exam:  General: alert, cooperative, appears stated age and no distress Lochia: appropriate Uterine Fundus: firm Incision: no significant drainage DVT Evaluation: No evidence of DVT seen on physical exam. Negative Homan's sign. No cords or calf tenderness. No significant calf/ankle edema.  Recent Labs    02/11/20 1405 02/12/20 2033  HGB 13.0 11.5*  HCT 39.5 33.5*    Assessment/Plan: Plan for discharge tomorrow, Discharge home, Breastfeeding, Circumcision prior to discharge and Contraception (condoms) Plan for discharge tomorrow and will discharge home Breastfeeding- lactation has seen Circumcision today 1/24 After extensive counseling patient denies contraception- will use condoms Counseled on return precautions, VTE precautions, infection precautions and pre-eclampsia precautions   LOS: 3 days   2/24 02/14/2020, 8:51 AM

## 2020-02-14 NOTE — Lactation Note (Signed)
This note was copied from a baby's chart. Lactation Consultation Note  Patient Name: Stephanie Molina [id number] Date: 02/14/2020 Reason for consult: Follow-up assessment Age:20 hours   LC Follow Up Visit:  Second attempt to visit with family, however, everyone remain asleep.  Spoke with RN who awakened mother approximately 1 hour 20 minutes ago to provide ibuprofen.  Asked RN to call me when mother awakens.     Maternal Data    Feeding    LATCH Score                   Interventions    Lactation Tools Discussed/Used Tools: Pump Breast pump type: Double-Electric Breast Pump Pump Education: Setup, frequency, and cleaning;Milk Storage Initiated by:: [name] Date initiated:: 02/14/20   Consult Status Consult Status: Follow-up Date: 02/15/20 Follow-up type: In-patient    Brynnleigh Mcelwee R Oleta Gunnoe 02/14/2020, 1:35 PM

## 2020-02-14 NOTE — Lactation Note (Signed)
This note was copied from a baby's chart. Lactation Consultation Note  Patient Name: Stephanie Molina [id number]'[id number] Date: 02/14/2020 Reason for consult: Early term 37-38.6wks;Primapara;1st time breastfeeding Age:20 hours   P1 mother whose infant is now 52 hours old.  This is an ETI at 38+3 weeks.  Mother's feeding preference is breast/bottle.  Mother has not fed baby since approximately 0825 this morning.  Reviewed the importance of feeding 8-12 times/24 hours or at least every three hours due to gestational age.  Offered to assist with latching and mother agreeable.  RN in room and changed baby's diaper in preparation to feed.  Asked mother to demonstrate hand expression.  She was unable to express colostrum drops at this time.  Container provided and milk storage times reviewed.  Finger feeding demonstrated.  Asked mother to continue practicing hand expression.  Mother hesitant to move in bed; assisted to help her sit more upright for feeding.  Assisted baby to latch in the cross cradle hole on the right breast after a few attempts.  Mother's breasts are soft and non tender and nipples are short shafted and intact.  Demonstrated breast compressions.  Baby took a few good intermittent sucks but would not sustain a latch.  After 10 minutes I suggested mother pump and begin to supplement.  Mother's preference for supplementation is formula.  Initiated the DEBP; pump parts, assembly, disassembly and cleaning reviewed.  Observed mother pumping and the #24 flange size is appropriate at this time.  She will save any EBM and feed it back to baby.  Updated RN and RN will follow up after pumping and provide the formula supplementation for mother.  Mother will call for latch assistance as needed.  She is a [facility] participant in [facility] and will be taking the [employer].  Father present and one other support person.    Maternal Data Formula Feeding for Exclusion: Yes Reason for exclusion:  Mother's choice to formula and breast feed on admission Has patient been taught Hand Expression?: Yes Does the patient have breastfeeding experience prior to this delivery?: No  Feeding Feeding Type: Breast Fed  LATCH Score Latch: Repeated attempts needed to sustain latch, nipple held in mouth throughout feeding, stimulation needed to elicit sucking reflex.  Audible Swallowing: None  Type of Nipple: Everted at rest and after stimulation (short shafted)  Comfort (Breast/Nipple): Soft / non-tender  Hold (Positioning): Assistance needed to correctly position infant at breast and maintain latch.  LATCH Score: 6  Interventions Interventions: Breast feeding basics reviewed;Assisted with latch;Skin to skin;Breast massage;Hand express;Breast compression;Adjust position;DEBP;Position options;Support pillows  Lactation Tools Discussed/Used Tools: Pump Breast pump type: Double-Electric Breast Pump;Manual WIC Program: Yes Pump Education: Setup, frequency, and cleaning;Milk Storage Initiated by:: Tiane Szydlowski Date initiated:: 02/14/20   Consult Status Consult Status: Follow-up Date: 02/15/20 Follow-up type: In-patient    Dora Sims 02/14/2020, 2:35 PM

## 2020-02-14 NOTE — Anesthesia Postprocedure Evaluation (Signed)
Anesthesia Post Note  Patient: Stephanie Molina  Procedure(s) Performed: AN AD HOC LABOR EPIDURAL     Patient location during evaluation: Mother Baby Anesthesia Type: Epidural Level of consciousness: awake Pain management: satisfactory to patient Vital Signs Assessment: post-procedure vital signs reviewed and stable Respiratory status: spontaneous breathing Cardiovascular status: stable Anesthetic complications: no   No complications documented.  Last Vitals:  Vitals:   02/13/20 2343 02/14/20 0618  BP: 119/85 110/69  Pulse: 91 72  Resp: 18 18  Temp: 36.7 C 36.8 C  SpO2: 100% 100%    Last Pain:  Vitals:   02/14/20 0618  TempSrc: Oral  PainSc:    Pain Goal:                   Cephus Shelling

## 2020-02-15 MED ORDER — IBUPROFEN 600 MG PO TABS: 600.0000 mg | ORAL_TABLET | Freq: Four times a day (QID) | ORAL | 0 refills | Status: DC

## 2020-02-15 NOTE — Lactation Note (Signed)
This note was copied from a baby's chart. Lactation Consultation Note  Patient Name: Stephanie Molina [id number] Date: 02/15/2020 Reason for consult: Follow-up assessment Age:20 years   Mother very quiet and then father spoke up and reports that infant is not feeding well because mothers milk is not in yet.  Father reports that they started giving formula until mothers milk comes in. Mother advised to begin latching the infant again to protect milk supply. Advised mother to breastfeed first and then offer infant a bottle. Offered to assist to latch the infant and assist with positioning and observed feeding. Father reports that they are going to start when they get home.   When ask if the parents were aware when infant swallows parents report that the could not hear infant swallow at the  Advised in this plan of care.   Breastfeed infant with feeding cues Supplement infant with ebm/formula, according to supplemental guidelines. Pump using a DEBP after each feeding for 15-20 mins.   Mother to continue to cue base feed infant and feed at least 8-12 times or more in 24 hours and advised to allow for cluster feeding infant as needed.  Mother to continue to due STS. Mother is aware of available LC services at [facility], [facility], OP Dept, and phone # for questions or concerns about breastfeeding.  Mother receptive to all teaching and plan of care.  .    Maternal Data    Feeding Feeding Type: Formula  LATCH Score                   Interventions    Lactation Tools Discussed/Used     Consult Status Consult Status: Complete    Michel Bickers 02/15/2020, 11:57 AM

## 2020-02-21 ENCOUNTER — Telehealth: Payer: Medicaid Other

## 2020-02-21 ENCOUNTER — Telehealth: Payer: Self-pay | Admitting: Women's Health

## 2020-02-21 NOTE — Telephone Encounter (Signed)
LVM for patient (mychart video)

## 2020-03-20 ENCOUNTER — Telehealth: Payer: Medicaid Other | Admitting: Women's Health

## 2020-10-24 ENCOUNTER — Ambulatory Visit (INDEPENDENT_AMBULATORY_CARE_PROVIDER_SITE_OTHER): Payer: Medicaid Other

## 2020-10-24 ENCOUNTER — Other Ambulatory Visit: Payer: Self-pay

## 2020-10-24 VITALS — BP 126/78 | HR 103 | Ht 65.0 in | Wt 173.6 lb

## 2020-10-24 DIAGNOSIS — Z3201 Encounter for pregnancy test, result positive: Secondary | ICD-10-CM | POA: Diagnosis not present

## 2020-10-24 DIAGNOSIS — Z3200 Encounter for pregnancy test, result unknown: Secondary | ICD-10-CM

## 2020-10-24 LAB — POCT URINE PREGNANCY: Preg Test, Ur: POSITIVE — AB

## 2020-10-24 MED ORDER — PRENATAL VITAMIN 27-0.8 MG PO TABS: ORAL_TABLET | ORAL | 3 refills | Status: AC

## 2020-10-24 MED ORDER — DOXYLAMINE-PYRIDOXINE 10-10 MG PO TBEC: DELAYED_RELEASE_TABLET | ORAL | 6 refills | Status: DC

## 2020-10-24 NOTE — Progress Notes (Addendum)
   NURSE VISIT- PREGNANCY CONFIRMATION   SUBJECTIVE:  Stephanie Molina is a 20 y.o. G30P1001 female at [redacted]w[redacted]d by certain LMP of Patient's last menstrual period was 09/11/2020 (exact date). Here for pregnancy confirmation.  Home pregnancy test: positive x 1   She reports  nausea & cramping .  She is not taking prenatal vitamins.    OBJECTIVE:  BP 126/78 (BP Location: Right Arm, Patient Position: Sitting, Cuff Size: Normal)   Pulse (!) 103   Ht 5\' 5"  (1.651 m)   Wt 173 lb 9.6 oz (78.7 kg)   LMP 09/11/2020 (Exact Date)   Breastfeeding No   BMI 28.89 kg/m   Appears well, in no apparent distress  Results for orders placed or performed in visit on 10/24/20 (from the past 24 hour(s))  POCT urine pregnancy   Collection Time: 10/24/20  2:33 PM  Result Value Ref Range   Preg Test, Ur Positive (A) Negative    ASSESSMENT: Positive pregnancy test, [redacted]w[redacted]d by LMP    PLAN: Schedule for dating ultrasound in 2-3 weeks Prenatal vitamins: note routed to [redacted]w[redacted]d to send prescription   Nausea medicines: requested-note routed to Shawna Clamp to send prescription   OB packet given: Yes  Jazmin Ley A Poonam Woehrle  10/24/2020 2:36 PM   Chart reviewed for nurse visit. Agree with plan of care. Rx pnv and diclegis. 12/24/2020, Cheral Marker 10/24/2020 2:43 PM

## 2020-10-24 NOTE — Addendum Note (Signed)
Addended by: Cheral Marker on: 10/24/2020 02:44 PM   Modules accepted: Orders

## 2020-11-15 ENCOUNTER — Other Ambulatory Visit: Payer: Self-pay | Admitting: Women's Health

## 2020-11-15 DIAGNOSIS — O3680X0 Pregnancy with inconclusive fetal viability, not applicable or unspecified: Secondary | ICD-10-CM

## 2020-11-16 ENCOUNTER — Ambulatory Visit (INDEPENDENT_AMBULATORY_CARE_PROVIDER_SITE_OTHER): Payer: Medicaid Other

## 2020-11-16 ENCOUNTER — Other Ambulatory Visit: Payer: Self-pay

## 2020-11-16 DIAGNOSIS — Z3A09 9 weeks gestation of pregnancy: Secondary | ICD-10-CM

## 2020-11-16 DIAGNOSIS — O3680X0 Pregnancy with inconclusive fetal viability, not applicable or unspecified: Secondary | ICD-10-CM

## 2020-11-16 NOTE — Progress Notes (Signed)
[place] 9+3 wks,single IUP with YS,FHR 171 bpm,normal ovaries,CRL 21.34 mm

## 2020-12-19 ENCOUNTER — Other Ambulatory Visit: Payer: Self-pay | Admitting: Obstetrics & Gynecology

## 2020-12-19 ENCOUNTER — Encounter: Payer: Self-pay | Admitting: Advanced Practice Midwife

## 2020-12-19 DIAGNOSIS — Z3682 Encounter for antenatal screening for nuchal translucency: Secondary | ICD-10-CM

## 2020-12-19 DIAGNOSIS — Z8759 Personal history of other complications of pregnancy, childbirth and the puerperium: Secondary | ICD-10-CM | POA: Insufficient documentation

## 2020-12-19 DIAGNOSIS — Z3490 Encounter for supervision of normal pregnancy, unspecified, unspecified trimester: Secondary | ICD-10-CM | POA: Insufficient documentation

## 2020-12-19 DIAGNOSIS — O09891 Supervision of other high risk pregnancies, first trimester: Secondary | ICD-10-CM | POA: Insufficient documentation

## 2020-12-20 ENCOUNTER — Ambulatory Visit (INDEPENDENT_AMBULATORY_CARE_PROVIDER_SITE_OTHER): Payer: Medicaid Other | Admitting: Advanced Practice Midwife

## 2020-12-20 ENCOUNTER — Encounter: Payer: Self-pay | Admitting: Advanced Practice Midwife

## 2020-12-20 ENCOUNTER — Other Ambulatory Visit: Payer: Self-pay

## 2020-12-20 ENCOUNTER — Ambulatory Visit: Payer: Medicaid Other | Admitting: *Deleted

## 2020-12-20 ENCOUNTER — Ambulatory Visit (INDEPENDENT_AMBULATORY_CARE_PROVIDER_SITE_OTHER): Payer: Medicaid Other

## 2020-12-20 VITALS — BP 116/78 | HR 78 | Wt 174.0 lb

## 2020-12-20 DIAGNOSIS — Z3480 Encounter for supervision of other normal pregnancy, unspecified trimester: Secondary | ICD-10-CM

## 2020-12-20 DIAGNOSIS — Z3682 Encounter for antenatal screening for nuchal translucency: Secondary | ICD-10-CM

## 2020-12-20 DIAGNOSIS — Z3A14 14 weeks gestation of pregnancy: Secondary | ICD-10-CM

## 2020-12-20 DIAGNOSIS — Z8759 Personal history of other complications of pregnancy, childbirth and the puerperium: Secondary | ICD-10-CM

## 2020-12-20 DIAGNOSIS — Z3400 Encounter for supervision of normal first pregnancy, unspecified trimester: Secondary | ICD-10-CM

## 2020-12-20 DIAGNOSIS — O09891 Supervision of other high risk pregnancies, first trimester: Secondary | ICD-10-CM | POA: Diagnosis not present

## 2020-12-20 DIAGNOSIS — Z363 Encounter for antenatal screening for malformations: Secondary | ICD-10-CM

## 2020-12-20 LAB — POCT URINALYSIS DIPSTICK OB
Blood, UA: NEGATIVE
Glucose, UA: NEGATIVE
Ketones, UA: NEGATIVE
Leukocytes, UA: NEGATIVE
Nitrite, UA: NEGATIVE
POC,PROTEIN,UA: NEGATIVE

## 2020-12-20 MED ORDER — ASPIRIN 81 MG PO CHEW: 162.0000 mg | CHEWABLE_TABLET | Freq: Every day | ORAL | 7 refills | Status: DC

## 2020-12-20 NOTE — Progress Notes (Signed)
[place] 14+2 wks,measurements c/w dates,CRL 76.97 mm,FHR 152 bpm,fundal placenta,normal ovaries,NB present,unable to obtain NT because of fetal position

## 2020-12-20 NOTE — Patient Instructions (Signed)
Hani, thank you for choosing our office today! We appreciate the opportunity to meet your healthcare needs. You may receive a short survey by mail, e-mail, or through MyChart. If you are happy with your care we would appreciate if you could take just a few minutes to complete the survey questions. We read all of your comments and take your feedback very seriously. Thank you again for choosing our office.  [facility] ([address] [place], [place] [address]) Entrance C, located off of [address] Free 24/7 valet parking   Nausea & Vomiting Have saltine crackers or pretzels by your bed and eat a few bites before you raise your head out of bed in the morning Eat small frequent meals throughout the day instead of large meals Drink plenty of fluids throughout the day to stay hydrated, just don't drink a lot of fluids with your meals.  This can make your stomach fill up faster making you feel sick Do not brush your teeth right after you eat Products with real ginger are good for nausea, like ginger ale and ginger hard candy Make sure it says made with real ginger! Sucking on sour candy like lemon heads is also good for nausea If your prenatal vitamins make you nauseated, take them at night so you will sleep through the nausea Sea Bands If you feel like you need medicine for the nausea & vomiting please let us know If you are unable to keep any fluids or food down please let us know   Constipation Drink plenty of fluid, preferably water, throughout the day Eat foods high in fiber such as fruits, vegetables, and grains Exercise, such as walking, is a good way to keep your bowels regular Drink warm fluids, especially warm prune juice, or decaf coffee Eat a 1/2 cup of real oatmeal (not instant), 1/2 cup applesauce, and 1/2-1 cup warm prune juice every day If needed, you may take Colace (docusate sodium) stool  softener once or twice a day to help keep the stool soft.  If you still are having problems with constipation, you may take Miralax once daily as needed to help keep your bowels regular.   Home Blood Pressure Monitoring for Patients   Your provider has recommended that you check your blood pressure (BP) at least once a week at home. If you do not have a blood pressure cuff at home, one will be provided for you. Contact your provider if you have not received your monitor within 1 week.   Helpful Tips for Accurate Home Blood Pressure Checks  Don't smoke, exercise, or drink caffeine 30 minutes before checking your BP Use the restroom before checking your BP (a full bladder can raise your pressure) Relax in a comfortable upright chair Feet on the ground Left arm resting comfortably on a flat surface at the level of your heart Legs uncrossed Back supported Sit quietly and don't talk Place the cuff on your bare arm Adjust snuggly, so that only two fingertips can fit between your skin and the top of the cuff Check 2 readings separated by at least one minute Keep a log of your BP readings For a visual, please reference this diagram: http://ccnc.care/bpdiagram  Provider Name: [facility]     Phone: [phone]  Zone 1: ALL CLEAR  Continue to monitor your symptoms:  BP reading is less than 140 (top number) or less than 90 (bottom   number)  No right upper stomach pain No headaches or seeing spots No feeling nauseated or throwing up No swelling in face and hands  Zone 2: CAUTION Call your doctor's office for any of the following:  BP reading is greater than 140 (top number) or greater than 90 (bottom number)  Stomach pain under your ribs in the middle or right side Headaches or seeing spots Feeling nauseated or throwing up Swelling in face and hands  Zone 3: EMERGENCY  Seek immediate medical care if you have any of the following:  BP reading is greater than160 (top number) or  greater than 110 (bottom number) Severe headaches not improving with Tylenol Serious difficulty catching your breath Any worsening symptoms from Zone 2    First Trimester of Pregnancy The first trimester of pregnancy is from week 1 until the end of week 12 (months 1 through 3). A week after a sperm fertilizes an egg, the egg will implant on the wall of the uterus. This embryo will begin to develop into a baby. Genes from you and your partner are forming the baby. The female genes determine whether the baby is a boy or a girl. At 6-8 weeks, the eyes and face are formed, and the heartbeat can be seen on ultrasound. At the end of 12 weeks, all the baby's organs are formed.  Now that you are pregnant, you will want to do everything you can to have a healthy baby. Two of the most important things are to get good prenatal care and to follow your health care provider's instructions. Prenatal care is all the medical care you receive before the baby's birth. This care will help prevent, find, and treat any problems during the pregnancy and childbirth. BODY CHANGES Your body goes through many changes during pregnancy. The changes vary from woman to woman.  You may gain or lose a couple of pounds at first. You may feel sick to your stomach (nauseous) and throw up (vomit). If the vomiting is uncontrollable, call your health care provider. You may tire easily. You may develop headaches that can be relieved by medicines approved by your health care provider. You may urinate more often. Painful urination may mean you have a bladder infection. You may develop heartburn as a result of your pregnancy. You may develop constipation because certain hormones are causing the muscles that push waste through your intestines to slow down. You may develop hemorrhoids or swollen, bulging veins (varicose veins). Your breasts may begin to grow larger and become tender. Your nipples may stick out more, and the tissue that  surrounds them (areola) may become darker. Your gums may bleed and may be sensitive to brushing and flossing. Dark spots or blotches (chloasma, mask of pregnancy) may develop on your face. This will likely fade after the baby is born. Your menstrual periods will stop. You may have a loss of appetite. You may develop cravings for certain kinds of food. You may have changes in your emotions from day to day, such as being excited to be pregnant or being concerned that something may go wrong with the pregnancy and baby. You may have more vivid and strange dreams. You may have changes in your hair. These can include thickening of your hair, rapid growth, and changes in texture. Some women also have hair loss during or after pregnancy, or hair that feels dry or thin. Your hair will most likely return to normal after your baby is born. WHAT TO EXPECT AT YOUR PRENATAL   VISITS During a routine prenatal visit: You will be weighed to make sure you and the baby are growing normally. Your blood pressure will be taken. Your abdomen will be measured to track your baby's growth. The fetal heartbeat will be listened to starting around week 10 or 12 of your pregnancy. Test results from any previous visits will be discussed. Your health care provider may ask you: How you are feeling. If you are feeling the baby move. If you have had any abnormal symptoms, such as leaking fluid, bleeding, severe headaches, or abdominal cramping. If you have any questions. Other tests that may be performed during your first trimester include: Blood tests to find your blood type and to check for the presence of any previous infections. They will also be used to check for low iron levels (anemia) and Rh antibodies. Later in the pregnancy, blood tests for diabetes will be done along with other tests if problems develop. Urine tests to check for infections, diabetes, or protein in the urine. An ultrasound to confirm the proper growth  and development of the baby. An amniocentesis to check for possible genetic problems. Fetal screens for spina bifida and Down syndrome. You may need other tests to make sure you and the baby are doing well. HOME CARE INSTRUCTIONS  Medicines Follow your health care provider's instructions regarding medicine use. Specific medicines may be either safe or unsafe to take during pregnancy. Take your prenatal vitamins as directed. If you develop constipation, try taking a stool softener if your health care provider approves. Diet Eat regular, well-balanced meals. Choose a variety of foods, such as meat or vegetable-based protein, fish, milk and low-fat dairy products, vegetables, fruits, and whole grain breads and cereals. Your health care provider will help you determine the amount of weight gain that is right for you. Avoid raw meat and uncooked cheese. These carry germs that can cause birth defects in the baby. Eating four or five small meals rather than three large meals a day may help relieve nausea and vomiting. If you start to feel nauseous, eating a few soda crackers can be helpful. Drinking liquids between meals instead of during meals also seems to help nausea and vomiting. If you develop constipation, eat more high-fiber foods, such as fresh vegetables or fruit and whole grains. Drink enough fluids to keep your urine clear or pale yellow. Activity and Exercise Exercise only as directed by your health care provider. Exercising will help you: Control your weight. Stay in shape. Be prepared for labor and delivery. Experiencing pain or cramping in the lower abdomen or low back is a good sign that you should stop exercising. Check with your health care provider before continuing normal exercises. Try to avoid standing for long periods of time. Move your legs often if you must stand in one place for a long time. Avoid heavy lifting. Wear low-heeled shoes, and practice good posture. You may  continue to have sex unless your health care provider directs you otherwise. Relief of Pain or Discomfort Wear a good support bra for breast tenderness.   Take warm sitz baths to soothe any pain or discomfort caused by hemorrhoids. Use hemorrhoid cream if your health care provider approves.   Rest with your legs elevated if you have leg cramps or low back pain. If you develop varicose veins in your legs, wear support hose. Elevate your feet for 15 minutes, 3-4 times a day. Limit salt in your diet. Prenatal Care Schedule your prenatal visits by the   twelfth week of pregnancy. They are usually scheduled monthly at first, then more often in the last 2 months before delivery. Write down your questions. Take them to your prenatal visits. Keep all your prenatal visits as directed by your health care provider. Safety Wear your seat belt at all times when driving. Make a list of emergency phone numbers, including numbers for family, friends, the hospital, and police and fire departments. General Tips Ask your health care provider for a referral to a local prenatal education class. Begin classes no later than at the beginning of month 6 of your pregnancy. Ask for help if you have counseling or nutritional needs during pregnancy. Your health care provider can offer advice or refer you to specialists for help with various needs. Do not use hot tubs, steam rooms, or saunas. Do not douche or use tampons or scented sanitary pads. Do not cross your legs for long periods of time. Avoid cat litter boxes and soil used by cats. These carry germs that can cause birth defects in the baby and possibly loss of the fetus by miscarriage or stillbirth. Avoid all smoking, herbs, alcohol, and medicines not prescribed by your health care provider. Chemicals in these affect the formation and growth of the baby. Schedule a dentist appointment. At home, brush your teeth with a soft toothbrush and be gentle when you floss. SEEK  MEDICAL CARE IF:  You have dizziness. You have mild pelvic cramps, pelvic pressure, or nagging pain in the abdominal area. You have persistent nausea, vomiting, or diarrhea. You have a bad smelling vaginal discharge. You have pain with urination. You notice increased swelling in your face, hands, legs, or ankles. SEEK IMMEDIATE MEDICAL CARE IF:  You have a fever. You are leaking fluid from your vagina. You have spotting or bleeding from your vagina. You have severe abdominal cramping or pain. You have rapid weight gain or loss. You vomit blood or material that looks like coffee grounds. You are exposed to German measles and have never had them. You are exposed to fifth disease or chickenpox. You develop a severe headache. You have shortness of breath. You have any kind of trauma, such as from a fall or a car accident. Document Released: 01/01/2001 Document Revised: 05/24/2013 Document Reviewed: 11/17/2012 Exit[employer] Patient Information 2015 [employer]. This information is not intended to replace advice given to you by your health care provider. Make sure you discuss any questions you have with your health care provider.  

## 2020-12-20 NOTE — Progress Notes (Signed)
INITIAL OBSTETRICAL VISIT Patient name: Stephanie Molina MRN [id number]  Date of birth: 10-10-2000 Chief Complaint:   Initial Prenatal Visit  History of Present Illness:   Stephanie Molina is a 20 y.o. G38P1001 Caucasian female at [redacted]w[redacted]d by LMP c/w u/s at 9.3 weeks with an Estimated Date of Delivery: 06/18/21 being seen today for her initial obstetrical visit.   Patient's last menstrual period was 09/11/2020 (exact date). Her obstetrical history is significant for  induced vag del 02/13/20 for gHTN .   Today she reports  feeling well; getting used to the idea of being pregnant again .  Last pap <21yo.   Depression screen [facility] 2/9 12/20/2020 11/26/2019 08/12/2019 06/30/2019  Decreased Interest 0 0 1 0  Down, Depressed, Hopeless 0 0 0 0  PHQ - 2 Score 0 0 1 0  Altered sleeping 0 0 2 1  Tired, decreased energy 1 1 1 1   Change in appetite 0 0 0 1  Feeling bad or failure about yourself  0 0 0 0  Trouble concentrating 0 0 0 0  Moving slowly or fidgety/restless 0 0 0 0  Suicidal thoughts 0 0 0 0  PHQ-9 Score 1 1 4 3   Difficult doing work/chores - - - Not difficult at all     GAD 7 : Generalized Anxiety Score 12/20/2020 11/26/2019 08/12/2019 06/30/2019  Nervous, Anxious, on Edge 0 0 0 0  Control/stop worrying 0 0 0 0  Worry too much - different things 0 0 0 0  Trouble relaxing 0 0 0 0  Restless 0 0 0 0  Easily annoyed or irritable 0 1 1 1   Afraid - awful might happen 0 0 0 0  Total GAD 7 Score 0 1 1 1   Anxiety Difficulty - - - Not difficult at all     Review of Systems:   Pertinent items are noted in HPI Denies cramping/contractions, leakage of fluid, vaginal bleeding, abnormal vaginal discharge w/ itching/odor/irritation, headaches, visual changes, shortness of breath, chest pain, abdominal pain, severe nausea/vomiting, or problems with urination or bowel movements unless otherwise stated above.  Pertinent History Reviewed:  Reviewed past medical,surgical, social, obstetrical and family  history.  Reviewed problem list, medications and allergies. OB History  Gravida Para Term Preterm AB Living  2 1 1  0 0 1  SAB IAB Ectopic Multiple Live Births  0 0 0 0 1    # Outcome Date GA Lbr Len/2nd Weight Sex Delivery Anes PTL Lv  2 Current           1 Term 02/13/20 [redacted]w[redacted]d 08:41 / 02:12 7 lb 1.8 oz (3.226 kg) M Vag-Spont EPI  LIV     Complications: Gestational hypertension   Physical Assessment:   Vitals:   12/20/20 0937  BP: 116/78  Pulse: 78  Weight: 174 lb (78.9 kg)  Body mass index is 28.96 kg/m.       Physical Examination:  General appearance - well appearing, and in no distress  Mental status - alert, oriented to person, place, and time  Psych:  She has a normal mood and affect  Skin - warm and dry, normal color, no suspicious lesions noted  Chest - effort normal, all lung fields clear to auscultation bilaterally  Heart - normal rate and regular rhythm  Abdomen - soft, nontender  Extremities:  No swelling or varicosities noted  Pelvic - not indicated  Thin prep pap is not done   TODAY'S NT (unable to obtain)  14+2 wks,measurements c/w dates,CRL 76.97 mm,FHR 152 bpm,fundal placenta,normal ovaries,NB present,unable to obtain NT because of fetal position  Results for orders placed or performed in visit on 12/20/20 (from the past 24 hour(s))  POC Urinalysis Dipstick OB   Collection Time: 12/20/20 10:20 AM  Result Value Ref Range   Color, UA     Clarity, UA     Glucose, UA Negative Negative   Bilirubin, UA     Ketones, UA neg    Spec Grav, UA     Blood, UA neg    pH, UA     POC,PROTEIN,UA Negative Negative, Trace, Small (1+), Moderate (2+), Large (3+), 4+   Urobilinogen, UA     Nitrite, UA neg    Leukocytes, UA Negative Negative   Appearance     Odor      Assessment & Plan:  1) Low-Risk Pregnancy G2P1001 at [redacted]w[redacted]d with an Estimated Date of Delivery: 06/18/21   2) Initial OB visit  3) Closely spaced pregnancies> SVB 02/13/20 after IOL for gHTN  4) Hx  gHTN, rx bASA 162mg  daily; baseline labs today  Meds:  Meds ordered this encounter  Medications   aspirin 81 MG chewable tablet    Sig: Chew 2 tablets (162 mg total) by mouth daily.    Dispense:  60 tablet    Refill:  7    Order Specific Question:   Supervising Provider    Answer:   Myna Hidalgo    Initial labs obtained Continue prenatal vitamins Reviewed n/v relief measures and warning s/s to report Reviewed recommended weight gain based on pre-gravid BMI Encouraged well-balanced diet Genetic & carrier screening discussed: requests NT/IT but unable to obtain- will get AFP at next visit. Ultrasound discussed; fetal survey: requested CCNC completed> form faxed if has or is planning to apply for medicaid The nature of [facility] [[id number]] with multiple MDs and other Advanced Practice Providers was explained to patient; also emphasized that fellows, residents, and students are part of our team. Does have home bp cuff. Office bp cuff given: no. Rx sent: n/a. Check bp weekly, let [employer] know if consistently >140/90.   Indications for ASA therapy (per uptodate) One of the following: H/O preeclampsia, especially early onset/adverse outcome Yes  No indications for early A1C (per uptodate)   Follow-up: Return in about 5 weeks (around 01/24/2021) for LROB, 03/24/2021: Anatomy; needs note from having appt today, AFP.   Orders Placed This Encounter  Procedures   Urine Culture   GC/Chlamydia Probe Amp   US OB Comp + 14 Wk   Protein / creatinine ratio, urine   Pain Management Screening Profile (10S)   Genetic Screening   CBC/D/Plt+RPR+Rh+ABO+RubIgG...   Comprehensive metabolic panel   POC Urinalysis Dipstick OB    [place] CNM 12/20/2020 1:30 PM

## 2020-12-21 LAB — PMP SCREEN PROFILE (10S), URINE
Amphetamine Scrn, Ur: NEGATIVE ng/mL
BARBITURATE SCREEN URINE: NEGATIVE ng/mL
BENZODIAZEPINE SCREEN, URINE: NEGATIVE ng/mL
CANNABINOIDS UR QL SCN: NEGATIVE ng/mL
Cocaine (Metab) Scrn, Ur: NEGATIVE ng/mL
Creatinine(Crt), U: 136.4 mg/dL (ref 20.0–300.0)
Methadone Screen, Urine: NEGATIVE ng/mL
OXYCODONE+OXYMORPHONE UR QL SCN: NEGATIVE ng/mL
Opiate Scrn, Ur: NEGATIVE ng/mL
Ph of Urine: 7.8 (ref 4.5–8.9)
Phencyclidine Qn, Ur: NEGATIVE ng/mL
Propoxyphene Scrn, Ur: NEGATIVE ng/mL

## 2020-12-21 LAB — CBC/D/PLT+RPR+RH+ABO+RUBIGG...
Antibody Screen: NEGATIVE
Basophils Absolute: 0 10*3/uL (ref 0.0–0.2)
Basos: 0 %
EOS (ABSOLUTE): 0.2 10*3/uL (ref 0.0–0.4)
Eos: 2 %
HCV Ab: 0.2 s/co ratio (ref 0.0–0.9)
HIV Screen 4th Generation wRfx: NONREACTIVE
Hematocrit: 40.6 % (ref 34.0–46.6)
Hemoglobin: 13.3 g/dL (ref 11.1–15.9)
Hepatitis B Surface Ag: NEGATIVE
Immature Grans (Abs): 0 10*3/uL (ref 0.0–0.1)
Immature Granulocytes: 0 %
Lymphocytes Absolute: 2.3 10*3/uL (ref 0.7–3.1)
Lymphs: 25 %
MCH: 30 pg (ref 26.6–33.0)
MCHC: 32.8 g/dL (ref 31.5–35.7)
MCV: 91 fL (ref 79–97)
Monocytes Absolute: 0.4 10*3/uL (ref 0.1–0.9)
Monocytes: 5 %
Neutrophils Absolute: 6.2 10*3/uL (ref 1.4–7.0)
Neutrophils: 68 %
Platelets: 184 10*3/uL (ref 150–450)
RBC: 4.44 x10E6/uL (ref 3.77–5.28)
RDW: 12 % (ref 11.7–15.4)
RPR Ser Ql: NONREACTIVE
Rh Factor: POSITIVE
Rubella Antibodies, IGG: 4.79 index (ref >0.99)
WBC: 9.1 10*3/uL (ref 3.4–10.8)

## 2020-12-21 LAB — COMPREHENSIVE METABOLIC PANEL
ALT: 7 IU/L (ref 0–32)
AST: 11 IU/L (ref 0–40)
Albumin/Globulin Ratio: 1.7 (ref 1.2–2.2)
Albumin: 3.8 g/dL — ABNORMAL LOW (ref 3.9–5.0)
Alkaline Phosphatase: 64 IU/L (ref 42–106)
BUN/Creatinine Ratio: 14 (ref 9–23)
BUN: 8 mg/dL (ref 6–20)
Bilirubin Total: 0.2 mg/dL (ref 0.0–1.2)
CO2: 22 mmol/L (ref 20–29)
Calcium: 9 mg/dL (ref 8.7–10.2)
Chloride: 104 mmol/L (ref 96–106)
Creatinine, Ser: 0.59 mg/dL (ref 0.57–1.00)
Globulin, Total: 2.3 g/dL (ref 1.5–4.5)
Glucose: 90 mg/dL (ref 70–99)
Potassium: 4 mmol/L (ref 3.5–5.2)
Sodium: 138 mmol/L (ref 134–144)
Total Protein: 6.1 g/dL (ref 6.0–8.5)
eGFR: 132 mL/min/{1.73_m2} (ref >59)

## 2020-12-21 LAB — PROTEIN / CREATININE RATIO, URINE
Creatinine, Urine: 113.1 mg/dL
Protein, Ur: 7.4 mg/dL
Protein/Creat Ratio: 65 mg/g creat (ref 0–200)

## 2020-12-21 LAB — HCV INTERPRETATION

## 2020-12-22 LAB — GC/CHLAMYDIA PROBE AMP
Chlamydia trachomatis, NAA: NEGATIVE
Neisseria Gonorrhoeae by PCR: NEGATIVE

## 2020-12-23 LAB — URINE CULTURE

## 2021-01-04 ENCOUNTER — Encounter: Payer: Self-pay | Admitting: Advanced Practice Midwife

## 2021-01-21 NOTE — L&D Delivery Note (Signed)
OB/[facility]N Faculty Practice Delivery Note  Stephanie Molina is a 21 y.o. G2P1001 s/p vag del at [redacted]w[redacted]d. She was admitted for IOL due to gHTN.   ROM: 4h 1m with clear fluid GBS Status: neg Maximum Maternal Temperature: 98.7  Labor Progress: Stephanie Molina was admitted on the morning of 06/08/21 for IOL due to gHTN; she had mild range BP elevations with neg pre-e labs; she progressed to complete after cytotec x 1 dose, Pitocin, and AROM.  Delivery Date/Time: May 20th, 2023 at 0058 Delivery: Called to room and patient was complete and pushing. Head delivered ROA. No nuchal cord present. Shoulder and body delivered in usual fashion. Infant with spontaneous cry, placed on mother's abdomen, dried and stimulated. Cord clamped x 2 after 1-minute delay, and cut by FOB. Cord blood drawn. Placenta delivered spontaneously with gentle cord traction. Fundus firm with massage and Pitocin. Labia, perineum, vagina, and cervix inspected and found to be intact.   Placenta: spont, intact; to L&D Complications: none Lacerations: none EBL: 150cc Analgesia: epidural  Postpartum Planning [ ]  has BP check in 1wk and PP visit for 4 wks  [ ]  will be NPO for ppBTL at 1100am  Infant: girl  APGARs 9/9  3260g (7lb 3oz)  Myrtis Ser, CNM  06/09/2021 1:17 AM

## 2021-01-24 ENCOUNTER — Ambulatory Visit (INDEPENDENT_AMBULATORY_CARE_PROVIDER_SITE_OTHER): Payer: Medicaid Other

## 2021-01-24 ENCOUNTER — Ambulatory Visit (INDEPENDENT_AMBULATORY_CARE_PROVIDER_SITE_OTHER): Payer: Medicaid Other | Admitting: Advanced Practice Midwife

## 2021-01-24 ENCOUNTER — Other Ambulatory Visit: Payer: Self-pay

## 2021-01-24 VITALS — BP 124/78 | HR 83 | Wt 177.0 lb

## 2021-01-24 DIAGNOSIS — Z3A19 19 weeks gestation of pregnancy: Secondary | ICD-10-CM | POA: Diagnosis not present

## 2021-01-24 DIAGNOSIS — O3503X0 Maternal care for (suspected) central nervous system malformation or damage in fetus, choroid plexus cysts, not applicable or unspecified: Secondary | ICD-10-CM

## 2021-01-24 DIAGNOSIS — Z3480 Encounter for supervision of other normal pregnancy, unspecified trimester: Secondary | ICD-10-CM

## 2021-01-24 DIAGNOSIS — Z1379 Encounter for other screening for genetic and chromosomal anomalies: Secondary | ICD-10-CM

## 2021-01-24 DIAGNOSIS — Z363 Encounter for antenatal screening for malformations: Secondary | ICD-10-CM | POA: Diagnosis not present

## 2021-01-24 NOTE — Patient Instructions (Signed)
Stephanie Molina, thank you for choosing our office today! We appreciate the opportunity to meet your healthcare needs. You may receive a short survey by mail, e-mail, or through MyChart. If you are happy with your care we would appreciate if you could take just a few minutes to complete the survey questions. We read all of your comments and take your feedback very seriously. Thank you again for choosing our office.  [facility] Team at [facility] ([address] [place], [place] [address]) Entrance C, located off of [address] Free 24/7 valet parking  Go to [email or web address] to register for FREE online childbirth classes  Call the office ([phone]) or go to [facility] if: You begin to severe cramping Your water breaks.  Sometimes it is a big gush of fluid, sometimes it is just a trickle that keeps getting your panties wet or running down your legs You have vaginal bleeding.  It is normal to have a small amount of spotting if your cervix was checked.   [place] Pediatricians/Family Doctors [facility] ([facility]): [address], [phone]           [facility]: [address], [phone]                [facility] ([facility]): [address], [phone] (call to ask if accepting patients) [facility]: [address], [place], [phone]    [place] Pediatricians/Family Doctors [facility] ([facility]): [address], [phone] [facility]: [address], [phone] [facility]: [address], [phone]  [place] [facility]  [facility] ([facility]): [phone] [facility]: [address], [phone]   [place] Family Doctors [facility]: [address], [phone]  [place] [facility]  [facility]: [address], [phone]  Home Blood Pressure Monitoring for Patients   Your provider has recommended that you check your blood pressure (BP) at least once a week at home. If you do not have a blood pressure cuff at home, one will be provided for you. Contact your provider if you have not received your monitor within 1 week.   Helpful Tips for Accurate Home Blood Pressure Checks  Don't smoke, exercise, or drink caffeine 30 minutes before checking your BP Use the restroom before checking your BP (a full bladder can raise your pressure) Relax in a comfortable upright chair Feet on the ground Left arm resting comfortably on a flat surface at the level of your heart Legs uncrossed Back supported Sit quietly and don't talk Place the cuff on your bare arm Adjust snuggly, so that only two fingertips can fit between your skin and the top of the cuff Check 2 readings separated by at least one minute Keep a log of your BP readings For a visual, please reference this diagram: http://ccnc.care/bpdiagram  Provider Name: [facility]     Phone: [phone]  Zone 1: ALL CLEAR  Continue to monitor your symptoms:  BP reading is less than 140 (top number) or less than 90 (bottom number)  No right upper stomach pain No headaches or seeing spots No feeling nauseated or throwing up No swelling in face and hands  Zone 2: CAUTION Call your doctor's office for any of the following:  BP reading is greater than 140 (top number) or greater than   90 (bottom number)  Stomach pain under your ribs in the middle or right side Headaches or seeing spots Feeling nauseated or throwing up Swelling in face and hands  Zone 3: EMERGENCY  Seek immediate medical care if you have any of the following:  BP reading is greater than160 (top number) or greater than 110 (bottom number) Severe headaches not improving with Tylenol Serious difficulty catching your breath Any worsening symptoms from  Zone 2     Second Trimester of Pregnancy The second trimester is from week 14 through week 27 (months 4 through 6). The second trimester is often a time when you feel your best. Your body has adjusted to being pregnant, and you begin to feel better physically. Usually, morning sickness has lessened or quit completely, you may have more energy, and you may have an increase in appetite. The second trimester is also a time when the fetus is growing rapidly. At the end of the sixth month, the fetus is about 9 inches long and weighs about 1 pounds. You will likely begin to feel the baby move (quickening) between 16 and 20 weeks of pregnancy. Body changes during your second trimester Your body continues to go through many changes during your second trimester. The changes vary from woman to woman. Your weight will continue to increase. You will notice your lower abdomen bulging out. You may begin to get stretch marks on your hips, abdomen, and breasts. You may develop headaches that can be relieved by medicines. The medicines should be approved by your health care provider. You may urinate more often because the fetus is pressing on your bladder. You may develop or continue to have heartburn as a result of your pregnancy. You may develop constipation because certain hormones are causing the muscles that push waste through your intestines to slow down. You may develop hemorrhoids or swollen, bulging veins (varicose veins). You may have back pain. This is caused by: Weight gain. Pregnancy hormones that are relaxing the joints in your pelvis. A shift in weight and the muscles that support your balance. Your breasts will continue to grow and they will continue to become tender. Your gums may bleed and may be sensitive to brushing and flossing. Dark spots or blotches (chloasma, mask of pregnancy) may develop on your face. This will likely fade after the baby is born. A dark line from your belly button to  the pubic area (linea nigra) may appear. This will likely fade after the baby is born. You may have changes in your hair. These can include thickening of your hair, rapid growth, and changes in texture. Some women also have hair loss during or after pregnancy, or hair that feels dry or thin. Your hair will most likely return to normal after your baby is born.  What to expect at prenatal visits During a routine prenatal visit: You will be weighed to make sure you and the fetus are growing normally. Your blood pressure will be taken. Your abdomen will be measured to track your baby's growth. The fetal heartbeat will be listened to. Any test results from the previous visit will be discussed.  Your health care provider may ask you: How you are feeling. If you are feeling the baby move. If you have had any abnormal symptoms, such as leaking fluid, bleeding, severe headaches, or abdominal cramping. If you are using any tobacco products, including cigarettes, chewing tobacco, and electronic cigarettes. If you have any questions.  Other tests that may be performed during   your second trimester include: Blood tests that check for: Low iron levels (anemia). High blood sugar that affects pregnant women (gestational diabetes) between 24 and 28 weeks. Rh antibodies. This is to check for a protein on red blood cells (Rh factor). Urine tests to check for infections, diabetes, or protein in the urine. An ultrasound to confirm the proper growth and development of the baby. An amniocentesis to check for possible genetic problems. Fetal screens for spina bifida and Down syndrome. HIV (human immunodeficiency virus) testing. Routine prenatal testing includes screening for HIV, unless you choose not to have this test.  Follow these instructions at home: Medicines Follow your health care provider's instructions regarding medicine use. Specific medicines may be either safe or unsafe to take during  pregnancy. Take a prenatal vitamin that contains at least 600 micrograms (mcg) of folic acid. If you develop constipation, try taking a stool softener if your health care provider approves. Eating and drinking Eat a balanced diet that includes fresh fruits and vegetables, whole grains, good sources of protein such as meat, eggs, or tofu, and low-fat dairy. Your health care provider will help you determine the amount of weight gain that is right for you. Avoid raw meat and uncooked cheese. These carry germs that can cause birth defects in the baby. If you have low calcium intake from food, talk to your health care provider about whether you should take a daily calcium supplement. Limit foods that are high in fat and processed sugars, such as fried and sweet foods. To prevent constipation: Drink enough fluid to keep your urine clear or pale yellow. Eat foods that are high in fiber, such as fresh fruits and vegetables, whole grains, and beans. Activity Exercise only as directed by your health care provider. Most women can continue their usual exercise routine during pregnancy. Try to exercise for 30 minutes at least 5 days a week. Stop exercising if you experience uterine contractions. Avoid heavy lifting, wear low heel shoes, and practice good posture. A sexual relationship may be continued unless your health care provider directs you otherwise. Relieving pain and discomfort Wear a good support bra to prevent discomfort from breast tenderness. Take warm sitz baths to soothe any pain or discomfort caused by hemorrhoids. Use hemorrhoid cream if your health care provider approves. Rest with your legs elevated if you have leg cramps or low back pain. If you develop varicose veins, wear support hose. Elevate your feet for 15 minutes, 3-4 times a day. Limit salt in your diet. Prenatal Care Write down your questions. Take them to your prenatal visits. Keep all your prenatal visits as told by your health  care provider. This is important. Safety Wear your seat belt at all times when driving. Make a list of emergency phone numbers, including numbers for family, friends, the hospital, and police and fire departments. General instructions Ask your health care provider for a referral to a local prenatal education class. Begin classes no later than the beginning of month 6 of your pregnancy. Ask for help if you have counseling or nutritional needs during pregnancy. Your health care provider can offer advice or refer you to specialists for help with various needs. Do not use hot tubs, steam rooms, or saunas. Do not douche or use tampons or scented sanitary pads. Do not cross your legs for long periods of time. Avoid cat litter boxes and soil used by cats. These carry germs that can cause birth defects in the baby and possibly loss of the   fetus by miscarriage or stillbirth. Avoid all smoking, herbs, alcohol, and unprescribed drugs. Chemicals in these products can affect the formation and growth of the baby. Do not use any products that contain nicotine or tobacco, such as cigarettes and e-cigarettes. If you need help quitting, ask your health care provider. Visit your dentist if you have not gone yet during your pregnancy. Use a soft toothbrush to brush your teeth and be gentle when you floss. Contact a health care provider if: You have dizziness. You have mild pelvic cramps, pelvic pressure, or nagging pain in the abdominal area. You have persistent nausea, vomiting, or diarrhea. You have a bad smelling vaginal discharge. You have pain when you urinate. Get help right away if: You have a fever. You are leaking fluid from your vagina. You have spotting or bleeding from your vagina. You have severe abdominal cramping or pain. You have rapid weight gain or weight loss. You have shortness of breath with chest pain. You notice sudden or extreme swelling of your face, hands, ankles, feet, or legs. You  have not felt your baby move in over an hour. You have severe headaches that do not go away when you take medicine. You have vision changes. Summary The second trimester is from week 14 through week 27 (months 4 through 6). It is also a time when the fetus is growing rapidly. Your body goes through many changes during pregnancy. The changes vary from woman to woman. Avoid all smoking, herbs, alcohol, and unprescribed drugs. These chemicals affect the formation and growth your baby. Do not use any tobacco products, such as cigarettes, chewing tobacco, and e-cigarettes. If you need help quitting, ask your health care provider. Contact your health care provider if you have any questions. Keep all prenatal visits as told by your health care provider. This is important. This information is not intended to replace advice given to you by your health care provider. Make sure you discuss any questions you have with your health care provider. Document Released: 01/01/2001 Document Revised: 06/15/2015 Document Reviewed: 03/10/2012 Elsevier Interactive Patient Education  2017 [employer].  

## 2021-01-24 NOTE — Progress Notes (Signed)
[place] 19+2 wks,cephalic,anterior placenta gr 0,normal ovaries,cx length 2.7 cm,SVP of fluid 5.4 cm,FHR 164 bpm,bilat complex choroid plexus cysts,left CPC 6.9 X 4.1 X 6.2 mm,right CPC 8.2 x 4.4 x 7.4 mm,EFW 251 g 16%,anatomy complete

## 2021-01-24 NOTE — Progress Notes (Signed)
° °  LOW-RISK PREGNANCY VISIT Patient name: Stephanie Molina MRN [id number]  Date of birth: 2000-06-13 Chief Complaint:   Routine Prenatal Visit  History of Present Illness:   Stephanie Molina is a 21 y.o. G46P1001 female at [redacted]w[redacted]d with an Estimated Date of Delivery: 06/18/21 being seen today for ongoing management of a low-risk pregnancy.  Today she reports no complaints. Contractions: Not present. Vag. Bleeding: None.  Movement: Present. denies leaking of fluid. Review of Systems:   Pertinent items are noted in HPI Denies abnormal vaginal discharge w/ itching/odor/irritation, headaches, visual changes, shortness of breath, chest pain, abdominal pain, severe nausea/vomiting, or problems with urination or bowel movements unless otherwise stated above. Pertinent History Reviewed:  Reviewed past medical,surgical, social, obstetrical and family history.  Reviewed problem list, medications and allergies. Physical Assessment:   Vitals:   01/24/21 0920  BP: 124/78  Pulse: 83  Weight: 177 lb (80.3 kg)  Body mass index is 29.45 kg/m.        Physical Examination:   General appearance: Well appearing, and in no distress  Mental status: Alert, oriented to person, place, and time  Skin: Warm & dry  Cardiovascular: Normal heart rate noted  Respiratory: Normal respiratory effort, no distress  Abdomen: Soft, gravid, nontender  Pelvic: Cervical exam deferred         Extremities: Edema: None  Fetal Status: Fetal Heart Rate (bpm): 164 u/s   Movement: Present    Anatomy scan:  [place] 19+2 wks,cephalic,anterior placenta gr 0,normal ovaries,cx length 2.7 cm,SVP of fluid 5.4 cm,FHR 164 bpm,bilat complex choroid plexus cysts,left CPC 6.9 X 4.1 X 6.2 mm,right CPC 8.2 x 4.4 x 7.4 mm,EFW 251 g 16%,anatomy complete  No results found for this or any previous visit (from the past 24 hour(s)).  Assessment & Plan:  1) Low-risk pregnancy G2P1001 at [redacted]w[redacted]d with an Estimated Date of Delivery: 06/18/21   2) Bilat CPCs  (45mm & 59mm), neg NIPS, will f/u ~ 32wks  3) Hx gHTN, taking bASA   Meds: No orders of the defined types were placed in this encounter.  Labs/procedures today: anatomy u/s; AFP  Plan:  Continue routine obstetrical care   Reviewed: Preterm labor symptoms and general obstetric precautions including but not limited to vaginal bleeding, contractions, leaking of fluid and fetal movement were reviewed in detail with the patient.  All questions were answered. Has home bp cuff.  Check bp weekly, let us know if >140/90.   Follow-up: Return in about 4 weeks (around 02/21/2021) for LROB, in person.  Orders Placed This Encounter  Procedures   AFP, Serum, Open Spina Bifida   Arabella Merles [facility] 01/24/2021 9:57 AM

## 2021-01-26 LAB — AFP, SERUM, OPEN SPINA BIFIDA
AFP MoM: 0.63
AFP Value: 30.8 ng/mL
Gest. Age on Collection Date: 19.3 weeks
Maternal Age At EDD: 21.1 yr
OSBR Risk 1 IN: 10000
Test Results:: NEGATIVE
Weight: 177 [lb_av]

## 2021-02-21 ENCOUNTER — Encounter: Payer: Medicaid Other | Admitting: Advanced Practice Midwife

## 2021-03-15 ENCOUNTER — Encounter: Payer: Self-pay | Admitting: Advanced Practice Midwife

## 2021-03-15 ENCOUNTER — Other Ambulatory Visit: Payer: Medicaid Other

## 2021-03-15 ENCOUNTER — Other Ambulatory Visit: Payer: Self-pay

## 2021-03-15 ENCOUNTER — Ambulatory Visit (INDEPENDENT_AMBULATORY_CARE_PROVIDER_SITE_OTHER): Payer: Medicaid Other | Admitting: Advanced Practice Midwife

## 2021-03-15 VITALS — BP 123/77 | HR 94 | Wt 186.0 lb

## 2021-03-15 DIAGNOSIS — O3503X0 Maternal care for (suspected) central nervous system malformation or damage in fetus, choroid plexus cysts, not applicable or unspecified: Secondary | ICD-10-CM

## 2021-03-15 DIAGNOSIS — Z131 Encounter for screening for diabetes mellitus: Secondary | ICD-10-CM

## 2021-03-15 DIAGNOSIS — Z23 Encounter for immunization: Secondary | ICD-10-CM | POA: Diagnosis not present

## 2021-03-15 DIAGNOSIS — Z3482 Encounter for supervision of other normal pregnancy, second trimester: Secondary | ICD-10-CM

## 2021-03-15 DIAGNOSIS — Z3A26 26 weeks gestation of pregnancy: Secondary | ICD-10-CM

## 2021-03-15 DIAGNOSIS — Z3480 Encounter for supervision of other normal pregnancy, unspecified trimester: Secondary | ICD-10-CM

## 2021-03-15 NOTE — Patient Instructions (Signed)
Stephanie Molina, I greatly value your feedback.  If you receive a survey following your visit with [place] today, we appreciate you taking the time to fill it out.  Thanks, Stephanie Molina, CNM   [facility] HAS MOVED!!! It is now [facility] ([address] [place], [place] [address]) Entrance located off of [address] Free 24/7 valet parking   Go to [email or web address] to register for FREE online childbirth classes    Call the office [phone]) or go to [facility] if: You begin to have strong, frequent contractions Your water breaks.  Sometimes it is a big gush of fluid, sometimes it is just a trickle that keeps getting your panties wet or running down your legs You have vaginal bleeding.  It is normal to have a small amount of spotting if your cervix was checked.  You don't feel your baby moving like normal.  If you don't, get you something to eat and drink and lay down and focus on feeling your baby move.  You should feel at least 10 movements in 2 hours.  If you don't, you should call the office or go to [facility].    Tdap Vaccine It is recommended that you get the Tdap vaccine during the third trimester of EACH pregnancy to help protect your baby from getting pertussis (whooping cough) 27-36 weeks is the BEST time to do this so that you can pass the protection on to your baby. During pregnancy is better than after pregnancy, but if you are unable to get it during pregnancy it will be offered at the hospital.  You will be offered this vaccine in the office after 27 weeks. If you do not have health insurance, you can get this vaccine at the health department or your family doctor Everyone who will be around your baby should also be up-to-date on their vaccines. Adults (who are not pregnant) only need 1 dose of Tdap during adulthood.   Third Trimester of Pregnancy The third trimester is from week 29 through week 42, months 7 through 9. The third  trimester is a time when the fetus is growing rapidly. At the end of the ninth month, the fetus is about 20 inches in length and weighs 6-10 pounds.  BODY CHANGES Your body goes through many changes during pregnancy. The changes vary from woman to woman.  Your weight will continue to increase. You can expect to gain 25-35 pounds (11-16 kg) by the end of the pregnancy. You may begin to get stretch marks on your hips, abdomen, and breasts. You may urinate more often because the fetus is moving lower into your pelvis and pressing on your bladder. You may develop or continue to have heartburn as a result of your pregnancy. You may develop constipation because certain hormones are causing the muscles that push waste through your intestines to slow down. You may develop hemorrhoids or swollen, bulging veins (varicose veins). You may have pelvic pain because of the weight gain and pregnancy hormones relaxing your joints between the bones in your pelvis. Backaches may result from overexertion of the muscles supporting your posture. You may have changes in your hair. These can include thickening of your hair, rapid growth, and changes in texture. Some women also have hair loss during or after pregnancy, or hair that feels dry or thin. Your hair will most likely return to normal after your baby is born. Your breasts will continue to grow and be tender. A  yellow discharge may leak from your breasts called colostrum. Your belly button may stick out. You may feel short of breath because of your expanding uterus. You may notice the fetus "dropping," or moving lower in your abdomen. You may have a bloody mucus discharge. This usually occurs a few days to a week before labor begins. Your cervix becomes thin and soft (effaced) near your due date. WHAT TO EXPECT AT YOUR PRENATAL EXAMS  You will have prenatal exams every 2 weeks until week 36. Then, you will have weekly prenatal exams. During a routine prenatal  visit: You will be weighed to make sure you and the fetus are growing normally. Your blood pressure is taken. Your abdomen will be measured to track your baby's growth. The fetal heartbeat will be listened to. Any test results from the previous visit will be discussed. You may have a cervical check near your due date to see if you have effaced. At around 36 weeks, your caregiver will check your cervix. At the same time, your caregiver will also perform a test on the secretions of the vaginal tissue. This test is to determine if a type of bacteria, Group B streptococcus, is present. Your caregiver will explain this further. Your caregiver may ask you: What your birth plan is. How you are feeling. If you are feeling the baby move. If you have had any abnormal symptoms, such as leaking fluid, bleeding, severe headaches, or abdominal cramping. If you have any questions. Other tests or screenings that may be performed during your third trimester include: Blood tests that check for low iron levels (anemia). Fetal testing to check the health, activity level, and growth of the fetus. Testing is done if you have certain medical conditions or if there are problems during the pregnancy. FALSE LABOR You may feel small, irregular contractions that eventually go away. These are called Braxton Hicks contractions, or false labor. Contractions may last for hours, days, or even weeks before true labor sets in. If contractions come at regular intervals, intensify, or become painful, it is best to be seen by your caregiver.  SIGNS OF LABOR  Menstrual-like cramps. Contractions that are 5 minutes apart or less. Contractions that start on the top of the uterus and spread down to the lower abdomen and back. A sense of increased pelvic pressure or back pain. A watery or bloody mucus discharge that comes from the vagina. If you have any of these signs before the 37th week of pregnancy, call your caregiver right away.  You need to go to the hospital to get checked immediately. HOME CARE INSTRUCTIONS  Avoid all smoking, herbs, alcohol, and unprescribed drugs. These chemicals affect the formation and growth of the baby. Follow your caregiver's instructions regarding medicine use. There are medicines that are either safe or unsafe to take during pregnancy. Exercise only as directed by your caregiver. Experiencing uterine cramps is a good sign to stop exercising. Continue to eat regular, healthy meals. Wear a good support bra for breast tenderness. Do not use hot tubs, steam rooms, or saunas. Wear your seat belt at all times when driving. Avoid raw meat, uncooked cheese, cat litter boxes, and soil used by cats. These carry germs that can cause birth defects in the baby. Take your prenatal vitamins. Try taking a stool softener (if your caregiver approves) if you develop constipation. Eat more high-fiber foods, such as fresh vegetables or fruit and whole grains. Drink plenty of fluids to keep your urine clear or pale yellow.  Take warm sitz baths to soothe any pain or discomfort caused by hemorrhoids. Use hemorrhoid cream if your caregiver approves. If you develop varicose veins, wear support hose. Elevate your feet for 15 minutes, 3-4 times a day. Limit salt in your diet. Avoid heavy lifting, wear low heal shoes, and practice good posture. Rest a lot with your legs elevated if you have leg cramps or low back pain. Visit your dentist if you have not gone during your pregnancy. Use a soft toothbrush to brush your teeth and be gentle when you floss. A sexual relationship may be continued unless your caregiver directs you otherwise. Do not travel far distances unless it is absolutely necessary and only with the approval of your caregiver. Take prenatal classes to understand, practice, and ask questions about the labor and delivery. Make a trial run to the hospital. Pack your hospital bag. Prepare the baby's  nursery. Continue to go to all your prenatal visits as directed by your caregiver. SEEK MEDICAL CARE IF: You are unsure if you are in labor or if your water has broken. You have dizziness. You have mild pelvic cramps, pelvic pressure, or nagging pain in your abdominal area. You have persistent nausea, vomiting, or diarrhea. You have a bad smelling vaginal discharge. You have pain with urination. SEEK IMMEDIATE MEDICAL CARE IF:  You have a fever. You are leaking fluid from your vagina. You have spotting or bleeding from your vagina. You have severe abdominal cramping or pain. You have rapid weight loss or gain. You have shortness of breath with chest pain. You notice sudden or extreme swelling of your face, hands, ankles, feet, or legs. You have not felt your baby move in over an hour. You have severe headaches that do not go away with medicine. You have vision changes. Document Released: 01/01/2001 Document Revised: 01/12/2013 Document Reviewed: 03/10/2012 [facility] Patient Information 2015 [place], [place]. This information is not intended to replace advice given to you by your health care provider. Make sure you discuss any questions you have with your health care provider.

## 2021-03-15 NOTE — Progress Notes (Signed)
° °  LOW-RISK PREGNANCY VISIT Patient name: Stephanie Molina MRN [id number]  Date of birth: 08-Jul-2000 Chief Complaint:   Routine Prenatal Visit (PN2 today)  History of Present Illness:   Stephanie Molina is a 21 y.o. G56P1001 female at [redacted]w[redacted]d with an Estimated Date of Delivery: 06/18/21 being seen today for ongoing management of a low-risk pregnancy.  Today she reports no complaints. Contractions: Irritability. Vag. Bleeding: None.  Movement: Present. denies leaking of fluid. Review of Systems:   Pertinent items are noted in HPI Denies abnormal vaginal discharge w/ itching/odor/irritation, headaches, visual changes, shortness of breath, chest pain, abdominal pain, severe nausea/vomiting, or problems with urination or bowel movements unless otherwise stated above. Pertinent History Reviewed:  Reviewed past medical,surgical, social, obstetrical and family history.  Reviewed problem list, medications and allergies. Physical Assessment:   Vitals:   03/15/21 1007  BP: 123/77  Pulse: 94  Weight: 186 lb (84.4 kg)  Body mass index is 30.95 kg/m.        Physical Examination:   General appearance: Well appearing, and in no distress  Mental status: Alert, oriented to person, place, and time  Skin: Warm & dry  Cardiovascular: Normal heart rate noted  Respiratory: Normal respiratory effort, no distress  Abdomen: Soft, gravid, nontender  Pelvic: Cervical exam deferred         Extremities: Edema: None  Fetal Status: Fetal Heart Rate (bpm): 134 Fundal Height: 26 cm Movement: Present    Chaperone: n/a    No results found for this or any previous visit (from the past 24 hour(s)).  Assessment & Plan:  1) Low-risk pregnancy G2P1001 at [redacted]w[redacted]d with an Estimated Date of Delivery: 06/18/21   2) Wants BTL, long discussion about LARCs, regret/depression when permanently sterilizing someone so young.  "Not interested in birth control".  Will schedule next visit w/Dr. Nelda Marseille, as I am not sure what her thoughts  are regarding this decision.    Meds: No orders of the defined types were placed in this encounter.  Labs/procedures today: PN2  Plan:  Continue routine obstetrical care  Next visit: prefers in person    Reviewed: Preterm labor symptoms and general obstetric precautions including but not limited to vaginal bleeding, contractions, leaking of fluid and fetal movement were reviewed in detail with the patient.  All questions were answered. Has home bp cuff.. Check bp weekly, let us know if >140/90.   Follow-up: Return for 3 weeks LROB; 6 weeks US(recheck CPC)/LROB with Dr. Nelda Marseille to dischss BTL.  Orders Placed This Encounter  Procedures   US OB Follow Up   Tdap vaccine greater than or equal to 7yo IM   Flu Vaccine QUAD 48mo+IM (Fluarix, Fluzone & Alfiuria Quad PF)   Christin Fudge DNP, CNM 03/15/2021 10:56 AM

## 2021-03-16 LAB — CBC
Hematocrit: 38.5 % (ref 34.0–46.6)
Hemoglobin: 12.5 g/dL (ref 11.1–15.9)
MCH: 29.8 pg (ref 26.6–33.0)
MCHC: 32.5 g/dL (ref 31.5–35.7)
MCV: 92 fL (ref 79–97)
Platelets: 184 10*3/uL (ref 150–450)
RBC: 4.19 x10E6/uL (ref 3.77–5.28)
RDW: 12 % (ref 11.7–15.4)
WBC: 9.5 10*3/uL (ref 3.4–10.8)

## 2021-03-16 LAB — GLUCOSE TOLERANCE, 2 HOURS W/ 1HR
Glucose, 1 hour: 76 mg/dL (ref 70–179)
Glucose, 2 hour: 64 mg/dL — ABNORMAL LOW (ref 70–152)
Glucose, Fasting: 88 mg/dL (ref 70–91)

## 2021-03-16 LAB — HIV ANTIBODY (ROUTINE TESTING W REFLEX): HIV Screen 4th Generation wRfx: NONREACTIVE

## 2021-03-16 LAB — RPR: RPR Ser Ql: NONREACTIVE

## 2021-03-16 LAB — ANTIBODY SCREEN: Antibody Screen: NEGATIVE

## 2021-03-30 ENCOUNTER — Encounter: Payer: Self-pay | Admitting: Advanced Practice Midwife

## 2021-04-05 ENCOUNTER — Ambulatory Visit (INDEPENDENT_AMBULATORY_CARE_PROVIDER_SITE_OTHER): Payer: Medicaid Other | Admitting: Obstetrics & Gynecology

## 2021-04-05 ENCOUNTER — Other Ambulatory Visit: Payer: Self-pay

## 2021-04-05 ENCOUNTER — Encounter: Payer: Self-pay | Admitting: Obstetrics & Gynecology

## 2021-04-05 VITALS — BP 129/79 | HR 90 | Wt 189.6 lb

## 2021-04-05 NOTE — Progress Notes (Signed)
? ?LOW-RISK PREGNANCY VISIT ?Patient name: Stephanie Molina MRN [id number]  Date of birth: February 13, 2000 ?Chief Complaint:   ?Routine Prenatal Visit ? ?History of Present Illness:   ?Stephanie Molina is a 21 y.o. G37P1001 female at [redacted]w[redacted]d with an Estimated Date of Delivery: 06/18/21 being seen today for ongoing management of a low-risk pregnancy.  ? ?Pt desires permanent sterilization.  She has tried medications in the past and noted considerable side effects.  Not interested in LARCs as she has heard "bad things."  Reports that she has had a lot of time to think about this- in the future wants to focus on a career.  Reports not wanting to be chasing after a toddler in her 58s.  Discussed that if she does change her mind, it will be "too bad" or she'll have to think of something else. ? ?Depression screen [facility] 2/9 03/15/2021 12/20/2020 11/26/2019 08/12/2019 06/30/2019  ?Decreased Interest 0 0 0 1 0  ?Down, Depressed, Hopeless 0 0 0 0 0  ?PHQ - 2 Score 0 0 0 1 0  ?Altered sleeping 1 0 0 2 1  ?Tired, decreased energy 1 1 1 1 1   ?Change in appetite 0 0 0 0 1  ?Feeling bad or failure about yourself  0 0 0 0 0  ?Trouble concentrating 0 0 0 0 0  ?Moving slowly or fidgety/restless 0 0 0 0 0  ?Suicidal thoughts 0 0 0 0 0  ?PHQ-9 Score 2 1 1 4 3   ?Difficult doing work/chores - - - - Not difficult at all  ? ? ?Today she reports  some lower pelvic discomfort . Contractions: Not present. Vag. Bleeding: None.  Movement: Present. denies leaking of fluid. ?Review of Systems:   ?Pertinent items are noted in HPI ?Denies abnormal vaginal discharge w/ itching/odor/irritation, headaches, visual changes, shortness of breath, chest pain, abdominal pain, severe nausea/vomiting, or problems with urination or bowel movements unless otherwise stated above. ?Pertinent History Reviewed:  ?Reviewed past medical,surgical, social, obstetrical and family history.  ?Reviewed problem list, medications and allergies. ? ?Physical Assessment:  ? ?Vitals:  ? 04/05/21  1349  ?BP: 129/79  ?Pulse: 90  ?Weight: 189 lb 9.6 oz (86 kg)  ?Body mass index is 31.55 kg/m?. ?  ?     Physical Examination:  ? General appearance: Well appearing, and in no distress ? Mental status: Alert, oriented to person, place, and time ? Skin: Warm & dry ? Respiratory: Normal respiratory effort, no distress ? Abdomen: Soft, gravid, nontender ? Pelvic: Cervical exam deferred        ? Extremities: Edema: None ? Psych:  mood and affect appropriate ? ?Fetal Status: Fetal Heart Rate (bpm): 145 Fundal Height: 28 cm Movement: Present   ? ?Chaperone: n/a   ? ?No results found for this or any previous visit (from the past 24 hour(s)).  ? ?Assessment & Plan:  ?1) Low-risk pregnancy G2P1001 at [redacted]w[redacted]d with an Estimated Date of Delivery: 06/18/21  ? ?2) Contraception- as above, risk/benefit were reviewed with patient.  She understands permanency of procedure and wishes to proceed.  Judgement appropriate ?  ?Meds: No orders of the defined types were placed in this encounter. ? ?Labs/procedures today: none ? ?Plan:  Continue routine obstetrical care  ?Next visit: prefers in person   ? ?Reviewed: Preterm labor symptoms and general obstetric precautions including but not limited to vaginal bleeding, contractions, leaking of fluid and fetal movement were reviewed in detail with the patient.  All questions were answered.  Pt has home bp cuff. Check bp weekly, let us know if >140/90.  ? ?Follow-up: Return in about 2 weeks (around 04/19/2021) for [place] visit, []  sign BTL. ? ?No orders of the defined types were placed in this encounter. ? ? ?Stephanie Pupa, DO ?Attending [place] ?[facility] ? ? ? ?

## 2021-04-10 ENCOUNTER — Ambulatory Visit (INDEPENDENT_AMBULATORY_CARE_PROVIDER_SITE_OTHER): Payer: Medicaid Other | Admitting: *Deleted

## 2021-04-10 ENCOUNTER — Other Ambulatory Visit: Payer: Self-pay

## 2021-04-10 VITALS — BP 131/82 | HR 99

## 2021-04-10 DIAGNOSIS — Z3A30 30 weeks gestation of pregnancy: Secondary | ICD-10-CM

## 2021-04-10 DIAGNOSIS — Z3483 Encounter for supervision of other normal pregnancy, third trimester: Secondary | ICD-10-CM

## 2021-04-10 LAB — POCT URINALYSIS DIPSTICK OB
Blood, UA: NEGATIVE
Glucose, UA: NEGATIVE
Ketones, UA: NEGATIVE
Nitrite, UA: NEGATIVE
POC,PROTEIN,UA: NEGATIVE

## 2021-04-10 NOTE — Progress Notes (Signed)
? ?  NURSE VISIT- BLOOD PRESSURE CHECK ? ?SUBJECTIVE:  ?Stephanie Molina is a 21 y.o. G44P1001 female here for BP check. She is [redacted]w[redacted]d pregnant   ? ?HYPERTENSION ROS: ? ?Pregnant/postpartum:  ?Severe headaches that don't go away with tylenol/other medicines: No  ?Visual changes (seeing spots/double/blurred vision) No  ?Severe pain under right breast breast or in center of upper chest No  ?Severe nausea/vomiting No  ?Taking medicines as instructed not applicable ? ?OBJECTIVE:  ?BP 131/82   Pulse 99   LMP 09/11/2020 (Exact Date)   ?Appearance alert, well appearing, and in no distress. ? ?ASSESSMENT: ?Pregnancy [redacted]w[redacted]d  blood pressure check ? ?PLAN: ?Discussed with Dr. Charlotta Newton   ?Recommendations: no changes needed   ?Follow-up: as scheduled  ? ?Annamarie Dawley  ?04/10/2021 ?2:52 PM  ?

## 2021-04-19 ENCOUNTER — Ambulatory Visit (INDEPENDENT_AMBULATORY_CARE_PROVIDER_SITE_OTHER): Payer: Medicaid Other | Admitting: Women's Health

## 2021-04-19 ENCOUNTER — Encounter: Payer: Self-pay | Admitting: Women's Health

## 2021-04-19 VITALS — BP 133/81 | HR 93 | Wt 192.0 lb

## 2021-04-19 DIAGNOSIS — Z3480 Encounter for supervision of other normal pregnancy, unspecified trimester: Secondary | ICD-10-CM

## 2021-04-19 DIAGNOSIS — Z3483 Encounter for supervision of other normal pregnancy, third trimester: Secondary | ICD-10-CM

## 2021-04-19 NOTE — Patient Instructions (Signed)
Stephanie Molina, thank you for choosing our office today! We appreciate the opportunity to meet your healthcare needs. You may receive a short survey by mail, e-mail, or through MyChart. If you are happy with your care we would appreciate if you could take just a few minutes to complete the survey questions. We read all of your comments and take your feedback very seriously. Thank you again for choosing our office.  ?[facility] ? ?[facility] ?([address] [place], [place] [address]) ?Entrance C, located off of [address] ?Free 24/7 valet parking  ? ?CLASSES: Go to [email or web address] to register for classes (childbirth, breastfeeding, waterbirth, infant CPR, daddy bootcamp, etc.) ? ?Call the office ([phone]) or go to [facility] if: ?You begin to have strong, frequent contractions ?Your water breaks.  Sometimes it is a big gush of fluid, sometimes it is just a trickle that keeps getting your panties wet or running down your legs ?You have vaginal bleeding.  It is normal to have a small amount of spotting if your cervix was checked.  ?You don't feel your baby moving like normal.  If you don't, get you something to eat and drink and lay down and focus on feeling your baby move.   If your baby is still not moving like normal, you should call the office or go to [facility]. ? ?Call the office ([phone]) or go to [facility] for these signs of pre-eclampsia: ?Severe headache that does not go away with Tylenol ?Visual changes- seeing spots, double, blurred vision ?Pain under your right breast or upper abdomen that does not go away with Tums or heartburn medicine ?Nausea and/or vomiting ?Severe swelling in your hands, feet, and face  ? ?Tdap Vaccine ?It is recommended that you get the Tdap vaccine during the third trimester of EACH pregnancy to help protect your baby from getting pertussis (whooping cough) ?27-36 weeks is the BEST time to do  this so that you can pass the protection on to your baby. During pregnancy is better than after pregnancy, but if you are unable to get it during pregnancy it will be offered at the hospital.  ?You can get this vaccine with us, at the health department, your family doctor, or some local pharmacies ?Everyone who will be around your baby should also be up-to-date on their vaccines before the baby comes. Adults (who are not pregnant) only need 1 dose of Tdap during adulthood.  ? ?[place] Pediatricians/Family Doctors ?[facility] ([facility]): [address], [phone]           ?[facility]: [address], [phone]                ?[facility] ([facility]): [address], [phone] (call to ask if accepting patients) ?[facility]: [address], [place], [phone]   ? ?[place] Pediatricians/Family Doctors ?[facility] ([facility]): [address], [phone] ?[facility]: [address], [phone] ?[facility]: [address], [phone] ? ?[place] Family Doctors  ?[facility] ([facility]): [phone] ?[facility]: [address], [phone]  ? ?[place] Family Doctors ?[facility]: [address], [phone] ? ?[place] [facility] Doctors  ?[facility]: [phone], [phone] ? ?Home Blood Pressure Monitoring for Patients  ? ?Your provider has recommended that you check your   blood pressure (BP) at least once a week at home. If you do not have a blood pressure cuff at home, one will be provided for you. Contact your provider if you have not received your monitor within 1 week.  ? ?Helpful Tips for Accurate Home Blood Pressure Checks  ?Don't smoke, exercise, or drink caffeine 30 minutes before checking your BP ?Use the restroom before checking your BP (a full bladder can raise your  pressure) ?Relax in a comfortable upright chair ?Feet on the ground ?Left arm resting comfortably on a flat surface at the level of your heart ?Legs uncrossed ?Back supported ?Sit quietly and don't talk ?Place the cuff on your bare arm ?Adjust snuggly, so that only two fingertips can fit between your skin and the top of the cuff ?Check 2 readings separated by at least one minute ?Keep a log of your BP readings ?For a visual, please reference this diagram: http://[facility]nc.care/bpdiagram ? ?Provider Name: [facility]     Phone: [phone] ? ?Zone 1: ALL CLEAR  ?Continue to monitor your symptoms:  ?BP reading is less than 140 (top number) or less than 90 (bottom number)  ?No right upper stomach pain ?No headaches or seeing spots ?No feeling nauseated or throwing up ?No swelling in face and hands ? ?Zone 2: CAUTION ?Call your doctor's office for any of the following:  ?BP reading is greater than 140 (top number) or greater than 90 (bottom number)  ?Stomach pain under your ribs in the middle or right side ?Headaches or seeing spots ?Feeling nauseated or throwing up ?Swelling in face and hands ? ?Zone 3: EMERGENCY  ?Seek immediate medical care if you have any of the following:  ?BP reading is greater than160 (top number) or greater than 110 (bottom number) ?Severe headaches not improving with Tylenol ?Serious difficulty catching your breath ?Any worsening symptoms from Zone 2  ?Preterm Labor and Birth Information ? ?The normal length of a pregnancy is 39-41 weeks. Preterm labor is when labor starts before 37 completed weeks of pregnancy. ?What are the risk factors for preterm labor? ?Preterm labor is more likely to occur in women who: ?Have certain infections during pregnancy such as a bladder infection, sexually transmitted infection, or infection inside the uterus (chorioamnionitis). ?Have a shorter-than-normal cervix. ?Have gone into preterm labor before. ?Have had surgery on their cervix. ?Are younger than age 72  or older than age 108. ?Are African American. ?Are pregnant with twins or multiple babies (multiple gestation). ?Take street drugs or smoke while pregnant. ?Do not gain enough weight while pregnant. ?Became pregnant shortly after having been pregnant. ?What are the symptoms of preterm labor? ?Symptoms of preterm labor include: ?Cramps similar to those that can happen during a menstrual period. The cramps may happen with diarrhea. ?Pain in the abdomen or lower back. ?Regular uterine contractions that may feel like tightening of the abdomen. ?A feeling of increased pressure in the pelvis. ?Increased watery or bloody mucus discharge from the vagina. ?Water breaking (ruptured amniotic sac). ?Why is it important to recognize signs of preterm labor? ?It is important to recognize signs of preterm labor because babies who are born prematurely may not be fully developed. This can put them at an increased risk for: ?Long-term (chronic) heart and lung problems. ?Difficulty immediately after birth with regulating body systems, including blood sugar, body temperature, heart rate, and breathing rate. ?Bleeding in the brain. ?Cerebral palsy. ?Learning difficulties. ?Death. ?These risks are highest for babies who are born before 11 weeks  of pregnancy. ?How is preterm labor treated? ?Treatment depends on the length of your pregnancy, your condition, and the health of your baby. It may involve: ?Having a stitch (suture) placed in your cervix to prevent your cervix from opening too early (cerclage). ?Taking or being given medicines, such as: ?Hormone medicines. These may be given early in pregnancy to help support the pregnancy. ?Medicine to stop contractions. ?Medicines to help mature the baby?s lungs. These may be prescribed if the risk of delivery is high. ?Medicines to prevent your baby from developing cerebral palsy. ?If the labor happens before 34 weeks of pregnancy, you may need to stay in the hospital. ?What should I do if I  think I am in preterm labor? ?If you think that you are going into preterm labor, call your health care provider right away. ?How can I prevent preterm labor in future pregnancies? ?To increase your chanc

## 2021-04-19 NOTE — Progress Notes (Signed)
? ? ?LOW-RISK PREGNANCY VISIT ?Patient name: Stephanie Molina MRN [id number]  Date of birth: 09-10-2000 ?Chief Complaint:   ?Routine Prenatal Visit ? ?History of Present Illness:   ?Stephanie Molina is a 21 y.o. G35P1001 female at [redacted]w[redacted]d with an Estimated Date of Delivery: 06/18/21 being seen today for ongoing management of a low-risk pregnancy.  ? ?Today she reports no complaints. Contractions: Not present. Vag. Bleeding: None.  Movement: Present. denies leaking of fluid. ? ? ?  03/15/2021  ? 10:04 AM 12/20/2020  ? 10:01 AM 11/26/2019  ?  9:29 AM 08/12/2019  ?  2:48 PM 06/30/2019  ?  2:48 PM  ?Depression screen PHQ 2/9  ?Decreased Interest 0 0 0 1 0  ?Down, Depressed, Hopeless 0 0 0 0 0  ?PHQ - 2 Score 0 0 0 1 0  ?Altered sleeping 1 0 0 2 1  ?Tired, decreased energy 1 1 1 1 1   ?Change in appetite 0 0 0 0 1  ?Feeling bad or failure about yourself  0 0 0 0 0  ?Trouble concentrating 0 0 0 0 0  ?Moving slowly or fidgety/restless 0 0 0 0 0  ?Suicidal thoughts 0 0 0 0 0  ?PHQ-9 Score 2 1 1 4 3   ?Difficult doing work/chores     Not difficult at all  ? ?  ? ?  03/15/2021  ? 10:04 AM 12/20/2020  ? 10:01 AM 11/26/2019  ?  9:29 AM 08/12/2019  ?  2:48 PM  ?GAD 7 : Generalized Anxiety Score  ?Nervous, Anxious, on Edge 0 0 0 0  ?Control/stop worrying 0 0 0 0  ?Worry too much - different things 0 0 0 0  ?Trouble relaxing 1 0 0 0  ?Restless 0 0 0 0  ?Easily annoyed or irritable 1 0 1 1  ?Afraid - awful might happen 0 0 0 0  ?Total GAD 7 Score 2 0 1 1  ? ? ?  ?Review of Systems:   ?Pertinent items are noted in HPI ?Denies abnormal vaginal discharge w/ itching/odor/irritation, headaches, visual changes, shortness of breath, chest pain, abdominal pain, severe nausea/vomiting, or problems with urination or bowel movements unless otherwise stated above. ?Pertinent History Reviewed:  ?Reviewed past medical,surgical, social, obstetrical and family history.  ?Reviewed problem list, medications and allergies. ?Physical Assessment:  ? ?Vitals:  ?  04/19/21 1008  ?BP: 133/81  ?Pulse: 93  ?Weight: 192 lb (87.1 kg)  ?Body mass index is 31.95 kg/m?. ?  ?     Physical Examination:  ? General appearance: Well appearing, and in no distress ? Mental status: Alert, oriented to person, place, and time ? Skin: Warm & dry ? Cardiovascular: Normal heart rate noted ? Respiratory: Normal respiratory effort, no distress ? Abdomen: Soft, gravid, nontender ? Pelvic: Cervical exam deferred        ? Extremities: Edema: None ? ?Fetal Status: Fetal Heart Rate (bpm): 160 Fundal Height: 30 cm Movement: Present   ? ?Chaperone: N/A   ?No results found for this or any previous visit (from the past 24 hour(s)).  ?Assessment & Plan:  ?1) Low-risk pregnancy G2P1001 at [redacted]w[redacted]d with an Estimated Date of Delivery: 06/18/21  ? ?2) H/O gHTN, ASA ? ?3) Fetal bilateral CPC> f/u next week as scheduled ?  ?Meds: No orders of the defined types were placed in this encounter. ? ?Labs/procedures today: none ? ?Plan:  Continue routine obstetrical care  ?Next visit: prefers will be in person for u/s    ? ?  Reviewed: Preterm labor symptoms and general obstetric precautions including but not limited to vaginal bleeding, contractions, leaking of fluid and fetal movement were reviewed in detail with the patient.  All questions were answered. Does have home bp cuff. Office bp cuff given: not applicable. Check bp weekly, let us know if consistently >140 and/or >90. ? ?Follow-up: Return for As scheduled. ? ?Future Appointments  ?Date Time Provider Department Center  ?04/26/2021  1:30 PM [facility] [place] [facility] None  ?04/26/2021  2:30 PM E[place], Amaryllis Dyke, MD [facility]  ? ? ?No orders of the defined types were placed in this encounter. ? ?Cheral Marker CNM, WHNP-BC ?04/19/2021 ?10:22 AM  ?

## 2021-04-25 ENCOUNTER — Other Ambulatory Visit: Payer: Self-pay | Admitting: Advanced Practice Midwife

## 2021-04-25 DIAGNOSIS — O3503X0 Maternal care for (suspected) central nervous system malformation or damage in fetus, choroid plexus cysts, not applicable or unspecified: Secondary | ICD-10-CM

## 2021-04-26 ENCOUNTER — Encounter: Payer: Self-pay | Admitting: Obstetrics & Gynecology

## 2021-04-26 ENCOUNTER — Ambulatory Visit (INDEPENDENT_AMBULATORY_CARE_PROVIDER_SITE_OTHER): Payer: Medicaid Other | Admitting: Obstetrics & Gynecology

## 2021-04-26 ENCOUNTER — Ambulatory Visit (INDEPENDENT_AMBULATORY_CARE_PROVIDER_SITE_OTHER): Payer: Medicaid Other

## 2021-04-26 VITALS — BP 117/74 | HR 96 | Wt 198.0 lb

## 2021-04-26 DIAGNOSIS — O3503X0 Maternal care for (suspected) central nervous system malformation or damage in fetus, choroid plexus cysts, not applicable or unspecified: Secondary | ICD-10-CM

## 2021-04-26 DIAGNOSIS — Z3A32 32 weeks gestation of pregnancy: Secondary | ICD-10-CM | POA: Diagnosis not present

## 2021-04-26 DIAGNOSIS — Z3480 Encounter for supervision of other normal pregnancy, unspecified trimester: Secondary | ICD-10-CM

## 2021-04-26 DIAGNOSIS — Z3483 Encounter for supervision of other normal pregnancy, third trimester: Secondary | ICD-10-CM

## 2021-04-26 NOTE — Progress Notes (Signed)
? ?  LOW-RISK PREGNANCY VISIT ?Patient name: Stephanie Molina MRN [id number]  Date of birth: 2000/11/29 ?Chief Complaint:   ?Routine Prenatal Visit ? ?History of Present Illness:   ?Stephanie Molina is a 21 y.o. G29P1001 female at [redacted]w[redacted]d with an Estimated Date of Delivery: 06/18/21 being seen today for ongoing management of a low-risk pregnancy.  ? ?  03/15/2021  ? 10:04 AM 12/20/2020  ? 10:01 AM 11/26/2019  ?  9:29 AM 08/12/2019  ?  2:48 PM 06/30/2019  ?  2:48 PM  ?Depression screen PHQ 2/9  ?Decreased Interest 0 0 0 1 0  ?Down, Depressed, Hopeless 0 0 0 0 0  ?PHQ - 2 Score 0 0 0 1 0  ?Altered sleeping 1 0 0 2 1  ?Tired, decreased energy 1 1 1 1 1   ?Change in appetite 0 0 0 0 1  ?Feeling bad or failure about yourself  0 0 0 0 0  ?Trouble concentrating 0 0 0 0 0  ?Moving slowly or fidgety/restless 0 0 0 0 0  ?Suicidal thoughts 0 0 0 0 0  ?PHQ-9 Score 2 1 1 4 3   ?Difficult doing work/chores     Not difficult at all  ? ? ?Today she reports no complaints. Contractions: Not present. Vag. Bleeding: None.  Movement: Present. denies leaking of fluid. ?Review of Systems:   ?Pertinent items are noted in HPI ?Denies abnormal vaginal discharge w/ itching/odor/irritation, headaches, visual changes, shortness of breath, chest pain, abdominal pain, severe nausea/vomiting, or problems with urination or bowel movements unless otherwise stated above. ?Pertinent History Reviewed:  ?Reviewed past medical,surgical, social, obstetrical and family history.  ?Reviewed problem list, medications and allergies. ?Physical Assessment:  ? ?Vitals:  ? 04/26/21 1431  ?BP: 117/74  ?Pulse: 96  ?Weight: 198 lb (89.8 kg)  ?Body mass index is 32.95 kg/m?. ?  ?     Physical Examination:  ? General appearance: Well appearing, and in no distress ? Mental status: Alert, oriented to person, place, and time ? Skin: Warm & dry ? Cardiovascular: Normal heart rate noted ? Respiratory: Normal respiratory effort, no distress ? Abdomen: Soft, gravid, nontender ? Pelvic:  Cervical exam deferred        ? Extremities: Edema: None ? ?Fetal Status: Fetal Heart Rate (bpm): 146   Movement: Present   ? ?Chaperone: n/a   ? ?No results found for this or any previous visit (from the past 24 hour(s)).  ?Assessment & Plan:  ?1) Low-risk pregnancy G2P1001 at [redacted]w[redacted]d with an Estimated Date of Delivery: 06/18/21  ? ?2) resolved CPCs,  ?  ?Meds: No orders of the defined types were placed in this encounter. ? ?Labs/procedures today: sonogram ? ?Plan:  Continue routine obstetrical care  ?Next visit: prefers in person   ? ?Reviewed: Preterm labor symptoms and general obstetric precautions including but not limited to vaginal bleeding, contractions, leaking of fluid and fetal movement were reviewed in detail with the patient.  All questions were answered. Has home bp cuff. Rx faxed to . Check bp weekly, let [redacted]w[redacted]d know if >140/90.  ? ?Follow-up: Return in about 3 weeks (around 05/17/2021) for LROB. ? ?No orders of the defined types were placed in this encounter. ? ? ?[place], MD ?04/26/2021 ?3:02 PM ? ?

## 2021-04-26 NOTE — Progress Notes (Signed)
[place] 32+3 wks,cephalic,anterior placenta gr 3,AFI 13.7 cm,FHR 150 bpm,resolved choroid plexus cysts,EFW 1858 g 25% ?

## 2021-04-30 IMAGING — CT CT ABD-PELV W/ CM
1 of 2 series · 15 of 32 positions shown, 19 images · IV contrast (APPLIED)
Comparison: CT 11/09/2018

CLINICAL DATA: Right lower quadrant pain, possible appendicitis

EXAM:
CT ABDOMEN AND PELVIS WITH CONTRAST
TECHNIQUE: Multidetector CT imaging of the abdomen and pelvis was performed
using the standard protocol following bolus administration of
intravenous contrast.
CONTRAST:  100mL [id number] IOPAMIDOL ([id number]) INJECTION 61%

[Series 2: abd/pelvis w/cm · axial · 0.72mm/px · z∈[-470,-65]mm · 15 of 91 slices shown, 19 images]
[im 5/91  soft-tissue]
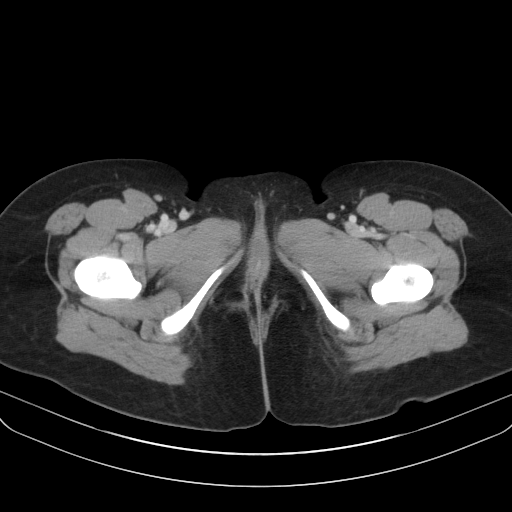
[im 5/91  bone]
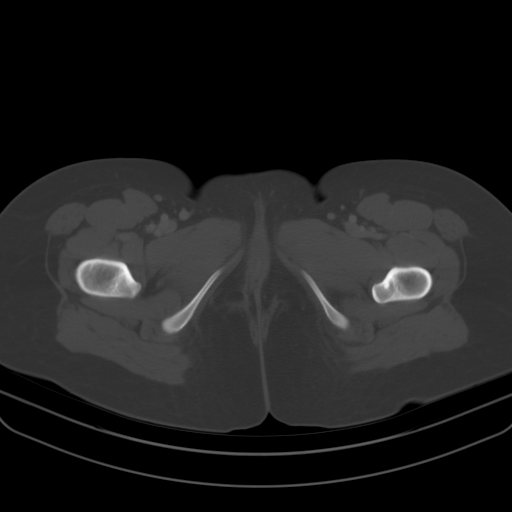
[im 13/91  soft-tissue]
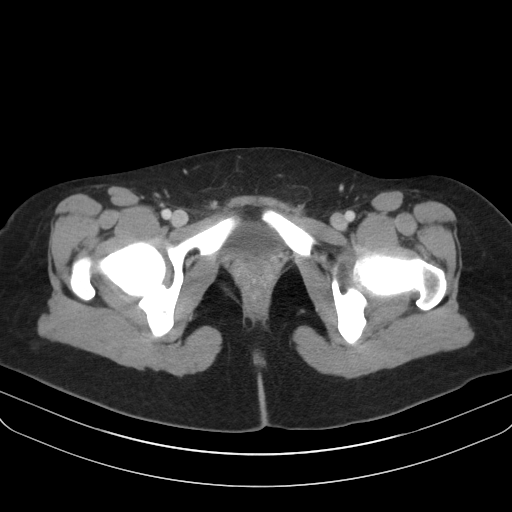
[im 21/91  soft-tissue]
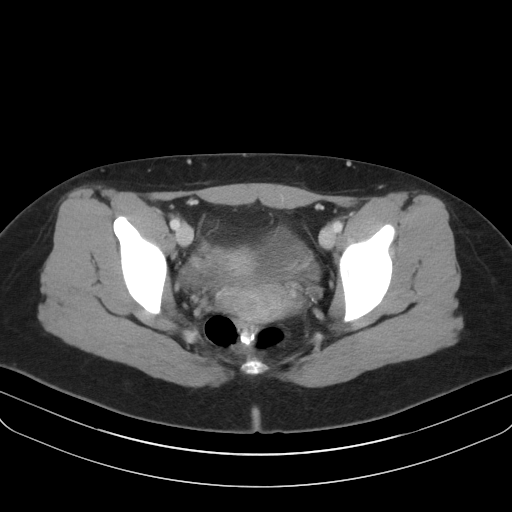
[im 25/91  soft-tissue]
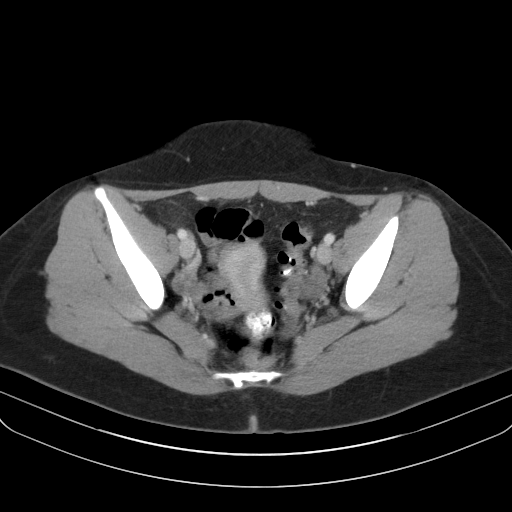
[im 33/91  soft-tissue]
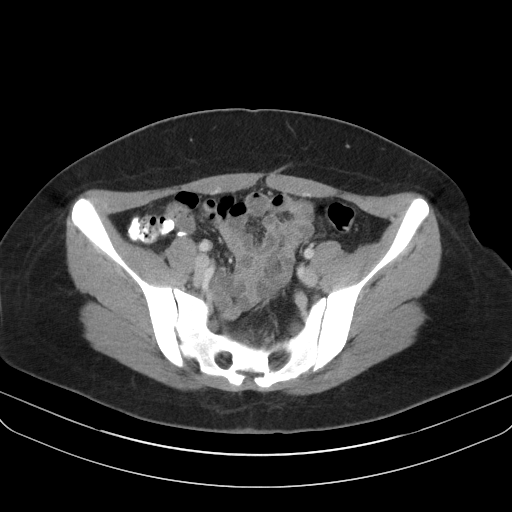
[im 37/91  soft-tissue]
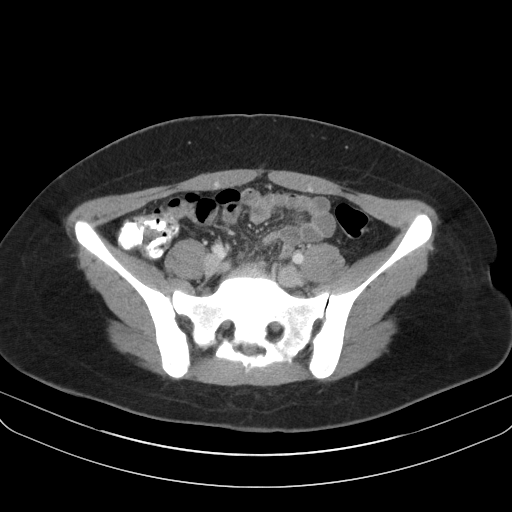
[im 46/91  soft-tissue]
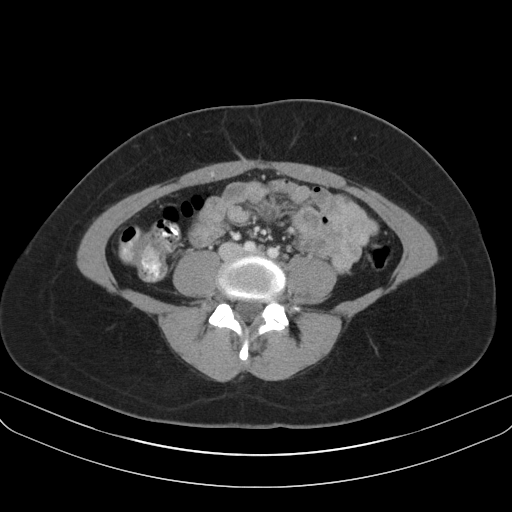
[im 54/91  soft-tissue]
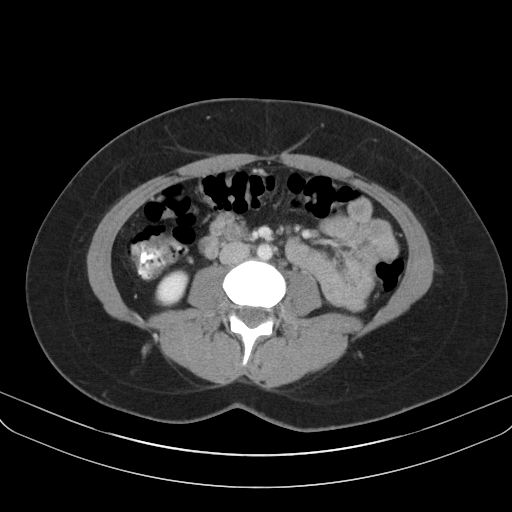
[im 58/91  soft-tissue]
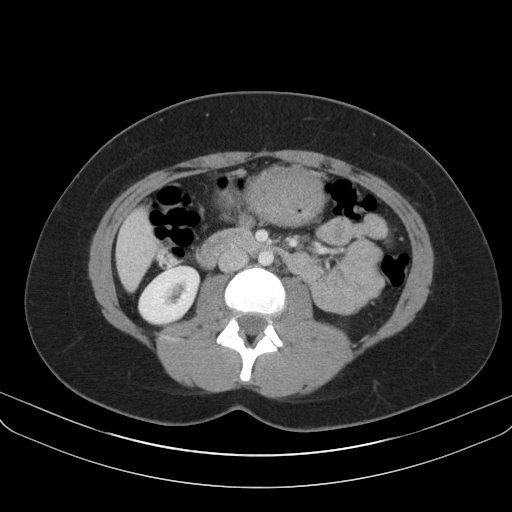
[im 58/91  bone]
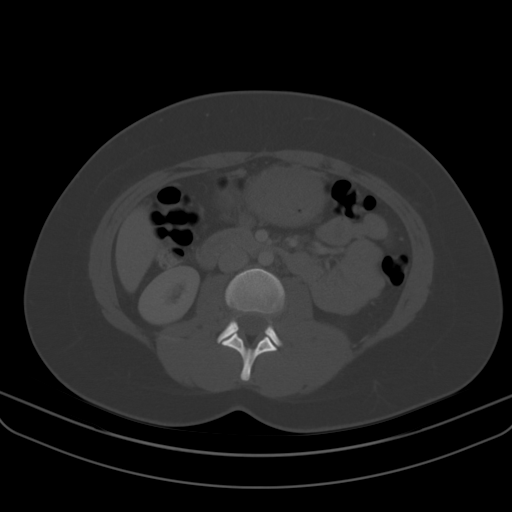
[im 66/91  soft-tissue]
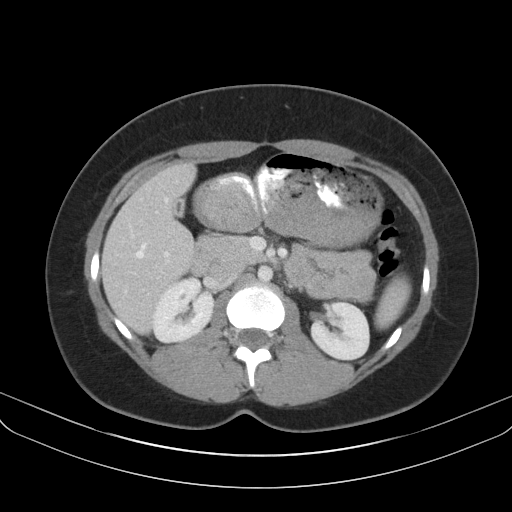
[im 70/91  soft-tissue]
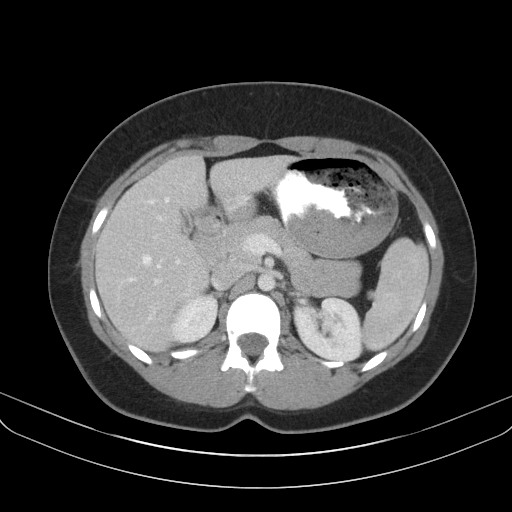
[im 74/91  lung]
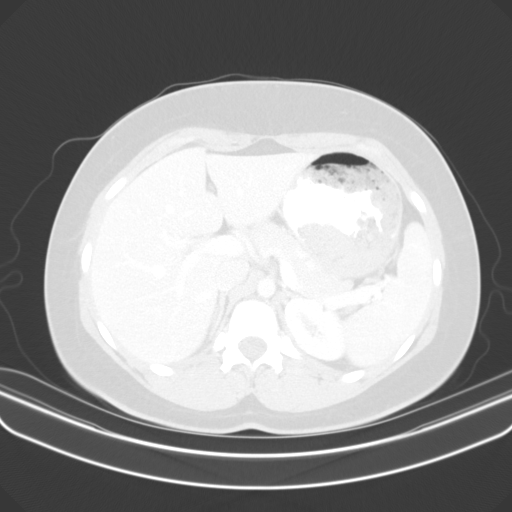
[im 78/91  soft-tissue]
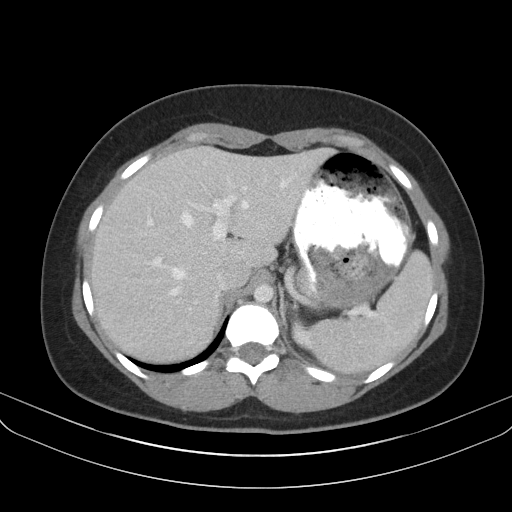
[im 78/91  lung]
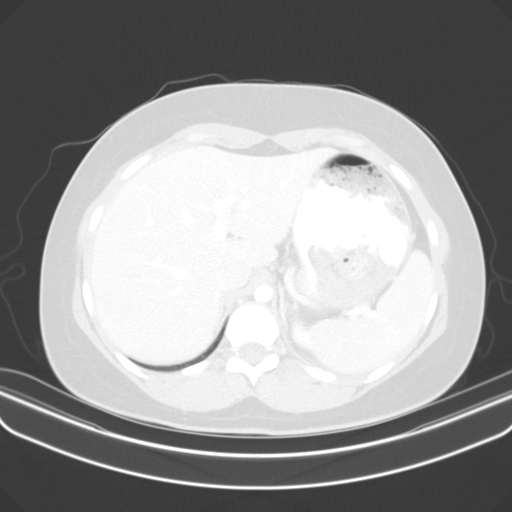
[im 82/91  lung]
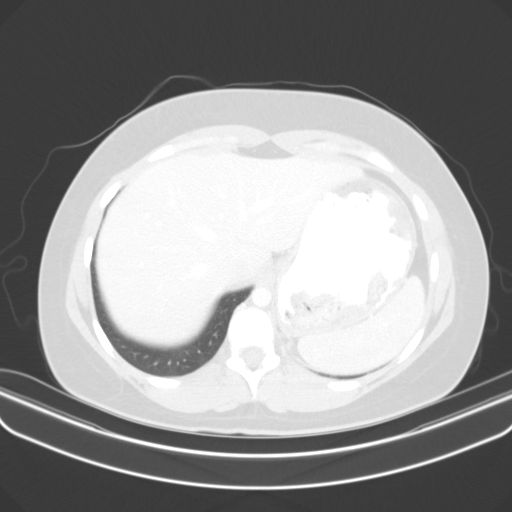
[im 86/91  soft-tissue]
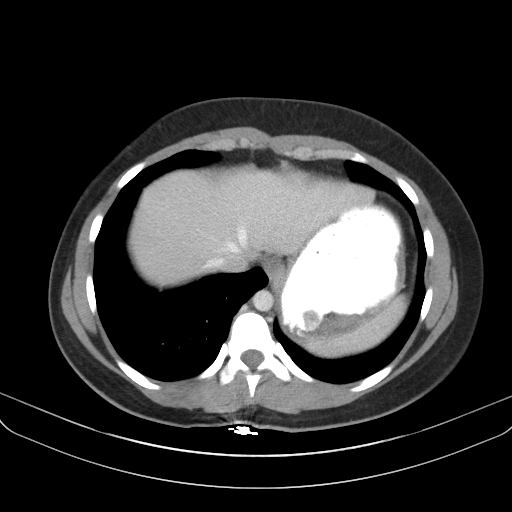
[im 86/91  lung]
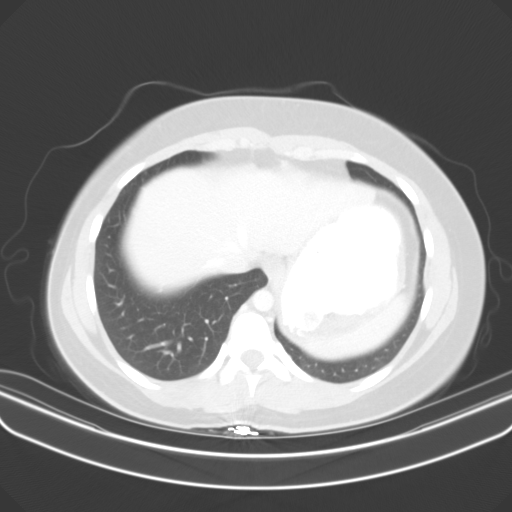

[15 of 32 positions shown; findings below may reference images not displayed]

FINDINGS: Lower chest: No acute abnormality.

Hepatobiliary: Contracted gallbladder. No focal hepatic abnormality.
No biliary dilatation

Pancreas: Unremarkable. No pancreatic ductal dilatation or
surrounding inflammatory changes.

Spleen: Normal in size without focal abnormality.

Adrenals/Urinary Tract: Adrenal glands are unremarkable. Kidneys are
normal, without renal calculi, focal lesion, or hydronephrosis.
Bladder is unremarkable.

Stomach/Bowel: Stomach is within normal limits. Appendix appears
normal. No evidence of bowel wall thickening, distention, or
inflammatory changes. Mild sigmoid colon diverticula without acute
inflammatory change.

Vascular/Lymphatic: No significant vascular findings are present. No
enlarged abdominal or pelvic lymph nodes.

Reproductive: Uterus and bilateral adnexa are unremarkable.
Resolution of left adnexal cyst.

Other: No abdominal wall hernia or abnormality. No abdominopelvic
ascites.

Musculoskeletal: No acute or significant osseous findings.
IMPRESSION: Negative. No CT evidence for acute intra-abdominal or pelvic
abnormality. Normal appearing appendix.

## 2021-05-17 ENCOUNTER — Encounter: Payer: Self-pay | Admitting: Women's Health

## 2021-05-17 ENCOUNTER — Ambulatory Visit (INDEPENDENT_AMBULATORY_CARE_PROVIDER_SITE_OTHER): Payer: Medicaid Other | Admitting: Women's Health

## 2021-05-17 VITALS — BP 133/77 | HR 98 | Wt 201.0 lb

## 2021-05-17 DIAGNOSIS — Z3483 Encounter for supervision of other normal pregnancy, third trimester: Secondary | ICD-10-CM

## 2021-05-17 DIAGNOSIS — Z3A35 35 weeks gestation of pregnancy: Secondary | ICD-10-CM

## 2021-05-17 NOTE — Patient Instructions (Signed)
Stephanie Molina, thank you for choosing our office today! We appreciate the opportunity to meet your healthcare needs. You may receive a short survey by mail, e-mail, or through [employer]. If you are happy with your care we would appreciate if you could take just a few minutes to complete the survey questions. We read all of your comments and take your feedback very seriously. Thank you again for choosing our office.  ?Center for [employer] Team at [facility] ? ?[facility] at [facility] ?([address] [place], [place] [address]) ?Entrance C, located off of [address] ?Free 24/7 valet parking  ? ?CLASSES: Go to [email or web address] to register for classes (childbirth, breastfeeding, waterbirth, infant CPR, daddy bootcamp, etc.) ? ?Call the office [phone]) or go to [facility] if: ?You begin to have strong, frequent contractions ?Your water breaks.  Sometimes it is a big gush of fluid, sometimes it is just a trickle that keeps getting your panties wet or running down your legs ?You have vaginal bleeding.  It is normal to have a small amount of spotting if your cervix was checked.  ?You don't feel your baby moving like normal.  If you don't, get you something to eat and drink and lay down and focus on feeling your baby move.   If your baby is still not moving like normal, you should call the office or go to [facility]. ? ?Call the office [phone]) or go to [facility] hospital for these signs of pre-eclampsia: ?Severe headache that does not go away with Tylenol ?Visual changes- seeing spots, double, blurred vision ?Pain under your right breast or upper abdomen that does not go away with Tums or heartburn medicine ?Nausea and/or vomiting ?Severe swelling in your hands, feet, and face  ? ?Tdap Vaccine ?It is recommended that you get the Tdap vaccine during the third trimester of EACH pregnancy to help protect your baby from getting pertussis (whooping cough) ?27-36 weeks is the BEST time to do  this so that you can pass the protection on to your baby. During pregnancy is better than after pregnancy, but if you are unable to get it during pregnancy it will be offered at the hospital.  ?You can get this vaccine with [place], at the health department, your family doctor, or some local pharmacies ?Everyone who will be around your baby should also be up-to-date on their vaccines before the baby comes. Adults (who are not pregnant) only need 1 dose of Tdap during adulthood.  ? ?[place] Pediatricians/Family Doctors ?[facility]): [address], [phone]           ?[facility]: [address], [phone]                ?[facility]): [address], [phone] (call to ask if accepting patients) ?[facility] Department: [address], [place], [phone]   ? ?[place] Pediatricians/Family Doctors ?[facility]): [address], [phone] ?[facility]: [address] [place], [phone] ?[facility]: [address], [phone] ? ?[employer] Family Doctors  ?[facility]): [phone] ?[facility]: [address], [phone]  ? ?[facility] Family [facility] ?[facility]: [address], [phone] ? ?[facility] Family Doctors  ?[facility]: [phone], [phone] ? ?Home Blood Pressure Monitoring for Patients  ? ?Your provider has recommended that you check your  blood pressure (BP) at least once a week at home. If you do not have a blood pressure cuff at home, one will be provided for you. Contact your provider if you have not received your monitor within 1 week.  ? ?Helpful Tips for Accurate Home Blood Pressure Checks  ?Don't smoke, exercise, or drink caffeine 30 minutes before checking your BP ?Use the restroom before checking your BP (a full bladder can raise your  pressure) ?Relax in a comfortable upright chair ?Feet on the ground ?Left arm resting comfortably on a flat surface at the level of your heart ?Legs uncrossed ?Back supported ?Sit quietly and don't talk ?Place the cuff on your bare arm ?Adjust snuggly, so that only two fingertips can fit between your skin and the top of the cuff ?Check 2 readings separated by at least one minute ?Keep a log of your BP readings ?For a visual, please reference this diagram: http://ccnc.care/bpdiagram ? ?Provider Name: [facility]     Phone: [phone] ? ?Zone 1: ALL CLEAR  ?Continue to monitor your symptoms:  ?BP reading is less than 140 (top number) or less than 90 (bottom number)  ?No right upper stomach pain ?No headaches or seeing spots ?No feeling nauseated or throwing up ?No swelling in face and hands ? ?Zone 2: CAUTION ?Call your doctor's office for any of the following:  ?BP reading is greater than 140 (top number) or greater than 90 (bottom number)  ?Stomach pain under your ribs in the middle or right side ?Headaches or seeing spots ?Feeling nauseated or throwing up ?Swelling in face and hands ? ?Zone 3: EMERGENCY  ?Seek immediate medical care if you have any of the following:  ?BP reading is greater than160 (top number) or greater than 110 (bottom number) ?Severe headaches not improving with Tylenol ?Serious difficulty catching your breath ?Any worsening symptoms from Zone 2  ?Preterm Labor and Birth Information ? ?The normal length of a pregnancy is 39-41 weeks. Preterm labor is when labor starts before 37 completed weeks of pregnancy. ?What are the risk factors for preterm labor? ?Preterm labor is more likely to occur in women who: ?Have certain infections during pregnancy such as a bladder infection, sexually transmitted infection, or infection inside the uterus (chorioamnionitis). ?Have a shorter-than-normal cervix. ?Have gone into preterm labor before. ?Have had surgery on their cervix. ?Are younger than age 72  or older than age 108. ?Are African American. ?Are pregnant with twins or multiple babies (multiple gestation). ?Take street drugs or smoke while pregnant. ?Do not gain enough weight while pregnant. ?Became pregnant shortly after having been pregnant. ?What are the symptoms of preterm labor? ?Symptoms of preterm labor include: ?Cramps similar to those that can happen during a menstrual period. The cramps may happen with diarrhea. ?Pain in the abdomen or lower back. ?Regular uterine contractions that may feel like tightening of the abdomen. ?A feeling of increased pressure in the pelvis. ?Increased watery or bloody mucus discharge from the vagina. ?Water breaking (ruptured amniotic sac). ?Why is it important to recognize signs of preterm labor? ?It is important to recognize signs of preterm labor because babies who are born prematurely may not be fully developed. This can put them at an increased risk for: ?Long-term (chronic) heart and lung problems. ?Difficulty immediately after birth with regulating body systems, including blood sugar, body temperature, heart rate, and breathing rate. ?Bleeding in the brain. ?Cerebral palsy. ?Learning difficulties. ?Death. ?These risks are highest for babies who are born before 11 weeks  of pregnancy. ?How is preterm labor treated? ?Treatment depends on the length of your pregnancy, your condition, and the health of your baby. It may involve: ?Having a stitch (suture) placed in your cervix to prevent your cervix from opening too early (cerclage). ?Taking or being given medicines, such as: ?Hormone medicines. These may be given early in pregnancy to help support the pregnancy. ?Medicine to stop contractions. ?Medicines to help mature the baby?s lungs. These may be prescribed if the risk of delivery is high. ?Medicines to prevent your baby from developing cerebral palsy. ?If the labor happens before 34 weeks of pregnancy, you may need to stay in the hospital. ?What should I do if I  think I am in preterm labor? ?If you think that you are going into preterm labor, call your health care provider right away. ?How can I prevent preterm labor in future pregnancies? ?To increase your chanc

## 2021-05-17 NOTE — Progress Notes (Addendum)
? ? ?LOW-RISK PREGNANCY VISIT ?Patient name: Stephanie Molina MRN [id number]  Date of birth: 2000-11-12 ?Chief Complaint:   ?Routine Prenatal Visit ? ?History of Present Illness:   ?Stephanie Molina is a 21 y.o. G58P1001 female at [redacted]w[redacted]d with an Estimated Date of Delivery: 06/18/21 being seen today for ongoing management of a low-risk pregnancy.  ? ?Today she reports no complaints. Contractions: Irritability. Vag. Bleeding: None.  Movement: Present. denies leaking of fluid. ? ? ?  03/15/2021  ? 10:04 AM 12/20/2020  ? 10:01 AM 11/26/2019  ?  9:29 AM 08/12/2019  ?  2:48 PM 06/30/2019  ?  2:48 PM  ?Depression screen PHQ 2/9  ?Decreased Interest 0 0 0 1 0  ?Down, Depressed, Hopeless 0 0 0 0 0  ?PHQ - 2 Score 0 0 0 1 0  ?Altered sleeping 1 0 0 2 1  ?Tired, decreased energy 1 1 1 1 1   ?Change in appetite 0 0 0 0 1  ?Feeling bad or failure about yourself  0 0 0 0 0  ?Trouble concentrating 0 0 0 0 0  ?Moving slowly or fidgety/restless 0 0 0 0 0  ?Suicidal thoughts 0 0 0 0 0  ?PHQ-9 Score 2 1 1 4 3   ?Difficult doing work/chores     Not difficult at all  ? ?  ? ?  03/15/2021  ? 10:04 AM 12/20/2020  ? 10:01 AM 11/26/2019  ?  9:29 AM 08/12/2019  ?  2:48 PM  ?GAD 7 : Generalized Anxiety Score  ?Nervous, Anxious, on Edge 0 0 0 0  ?Control/stop worrying 0 0 0 0  ?Worry too much - different things 0 0 0 0  ?Trouble relaxing 1 0 0 0  ?Restless 0 0 0 0  ?Easily annoyed or irritable 1 0 1 1  ?Afraid - awful might happen 0 0 0 0  ?Total GAD 7 Score 2 0 1 1  ? ? ?  ?Review of Systems:   ?Pertinent items are noted in HPI ?Denies abnormal vaginal discharge w/ itching/odor/irritation, headaches, visual changes, shortness of breath, chest pain, abdominal pain, severe nausea/vomiting, or problems with urination or bowel movements unless otherwise stated above. ?Pertinent History Reviewed:  ?Reviewed past medical,surgical, social, obstetrical and family history.  ?Reviewed problem list, medications and allergies. ?Physical Assessment:  ? ?Vitals:  ?  05/17/21 1403  ?BP: 133/77  ?Pulse: 98  ?Weight: 201 lb (91.2 kg)  ?Body mass index is 33.45 kg/m?. ?  ?     Physical Examination:  ? General appearance: Well appearing, and in no distress ? Mental status: Alert, oriented to person, place, and time ? Skin: Warm & dry ? Cardiovascular: Normal heart rate noted ? Respiratory: Normal respiratory effort, no distress ? Abdomen: Soft, gravid, nontender ? Pelvic: Cervical exam deferred        ? Extremities: Edema: None ? ?Fetal Status: Fetal Heart Rate (bpm): 140 Fundal Height: 33 cm Movement: Present   ? ?Chaperone: N/A   ?No results found for this or any previous visit (from the past 24 hour(s)).  ?Assessment & Plan:  ?1) Low-risk pregnancy G2P1001 at [redacted]w[redacted]d with an Estimated Date of Delivery: 06/18/21  ? ?2) H/O gHTN, bp good, ASA ?  ?Meds: No orders of the defined types were placed in this encounter. ? ?Labs/procedures today: none ? ?Plan:  Continue routine obstetrical care  ?Next visit: prefers will be in person for cultures    ? ?Reviewed: Preterm labor symptoms and general obstetric precautions  including but not limited to vaginal bleeding, contractions, leaking of fluid and fetal movement were reviewed in detail with the patient.  All questions were answered. Does have home bp cuff. Office bp cuff given: not applicable. Check bp weekly, let us know if consistently >140 and/or >90. ? ?Follow-up: Return for weekly, CNM, in person. ? ?No future appointments. ? ?No orders of the defined types were placed in this encounter. ? ?Cheral Marker CNM, WHNP-BC ?05/17/2021 ?2:15 PM  ?

## 2021-05-24 ENCOUNTER — Ambulatory Visit (INDEPENDENT_AMBULATORY_CARE_PROVIDER_SITE_OTHER): Payer: Medicaid Other | Admitting: Obstetrics & Gynecology

## 2021-05-24 ENCOUNTER — Encounter: Payer: Self-pay | Admitting: Obstetrics & Gynecology

## 2021-05-24 ENCOUNTER — Other Ambulatory Visit (HOSPITAL_COMMUNITY)
Admission: RE | Admit: 2021-05-24 | Discharge: 2021-05-24 | Disposition: A | Discharge status: Home / Self Care | Payer: Medicaid Other | Source: Ambulatory Visit | Attending: Obstetrics & Gynecology | Admitting: Obstetrics & Gynecology

## 2021-05-24 VITALS — BP 134/84 | HR 114 | Wt 203.8 lb

## 2021-05-24 DIAGNOSIS — Z3403 Encounter for supervision of normal first pregnancy, third trimester: Secondary | ICD-10-CM | POA: Insufficient documentation

## 2021-05-24 DIAGNOSIS — Z3A36 36 weeks gestation of pregnancy: Secondary | ICD-10-CM

## 2021-05-24 NOTE — Progress Notes (Signed)
? ?  LOW-RISK PREGNANCY VISIT ?Patient name: Stephanie Molina MRN [id number]  Date of birth: 04-11-2000 ?Chief Complaint:   ?Routine Prenatal Visit ? ?History of Present Illness:   ?Stephanie Molina is a 21 y.o. G17P1001 female at [redacted]w[redacted]d with an Estimated Date of Delivery: 06/18/21 being seen today for ongoing management of a low-risk pregnancy.  ? ?  03/15/2021  ? 10:04 AM 12/20/2020  ? 10:01 AM 11/26/2019  ?  9:29 AM 08/12/2019  ?  2:48 PM 06/30/2019  ?  2:48 PM  ?Depression screen PHQ 2/9  ?Decreased Interest 0 0 0 1 0  ?Down, Depressed, Hopeless 0 0 0 0 0  ?PHQ - 2 Score 0 0 0 1 0  ?Altered sleeping 1 0 0 2 1  ?Tired, decreased energy 1 1 1 1 1   ?Change in appetite 0 0 0 0 1  ?Feeling bad or failure about yourself  0 0 0 0 0  ?Trouble concentrating 0 0 0 0 0  ?Moving slowly or fidgety/restless 0 0 0 0 0  ?Suicidal thoughts 0 0 0 0 0  ?PHQ-9 Score 2 1 1 4 3   ?Difficult doing work/chores     Not difficult at all  ? ? ?Today she reports occasional contractions. Contractions: Irritability. Vag. Bleeding: None.  Movement: Present. denies leaking of fluid. ?Review of Systems:   ?Pertinent items are noted in HPI ?Denies abnormal vaginal discharge w/ itching/odor/irritation, headaches, visual changes, shortness of breath, chest pain, abdominal pain, severe nausea/vomiting, or problems with urination or bowel movements unless otherwise stated above. ?Pertinent History Reviewed:  ?Reviewed past medical,surgical, social, obstetrical and family history.  ?Reviewed problem list, medications and allergies. ? ?Physical Assessment:  ? ?Vitals:  ? 05/24/21 1604  ?BP: 134/84  ?Pulse: (!) 114  ?Weight: 203 lb 12.8 oz (92.4 kg)  ?Body mass index is 33.91 kg/m?. ?  ?     Physical Examination:  ? General appearance: Well appearing, and in no distress ? Mental status: Alert, oriented to person, place, and time ? Skin: Warm & dry ? Respiratory: Normal respiratory effort, no distress ? Abdomen: Soft, gravid, nontender ? Pelvic: Cervical exam  performed  Dilation: 2 Effacement (%): 50 Station: -2 ? Extremities: Edema: None ? Psych:  mood and affect appropriate ? ?Fetal Status: Fetal Heart Rate (bpm): 150 Fundal Height: 35 cm Movement: Present Presentation: Vertex ? ?Chaperone:  Glenard Haring Neas    ? ?No results found for this or any previous visit (from the past 24 hour(s)).  ? ?Assessment & Plan:  ?1) Low-risk pregnancy G2P1001 at [redacted]w[redacted]d with an Estimated Date of Delivery: 06/18/21  ? ? ?  ?Meds: No orders of the defined types were placed in this encounter. ? ?Labs/procedures today: GBS, GC/C obtained ? ?Plan:  Continue routine obstetrical care  ?Next visit: prefers in person   ? ?Reviewed: Preterm labor symptoms and general obstetric precautions including but not limited to vaginal bleeding, contractions, leaking of fluid and fetal movement were reviewed in detail with the patient.  All questions were answered. Pt has home bp cuff. Check bp weekly, let us know if >140/90.  ? ?Follow-up: Return in about 1 week (around 05/31/2021) for [place] visit. ? ?Orders Placed This Encounter  ?Procedures  ? Strep Gp B NAA  ? ? ?Janyth Pupa, DO ?Attending [place], Faculty Practice ?Center for [place] ? ? ? ?

## 2021-05-26 LAB — SPECIMEN STATUS REPORT

## 2021-05-26 LAB — STREP GP B NAA: Strep Gp B NAA: NEGATIVE

## 2021-05-28 LAB — CERVICOVAGINAL ANCILLARY ONLY
Chlamydia: NEGATIVE
Comment: NEGATIVE
Comment: NORMAL
Neisseria Gonorrhea: NEGATIVE

## 2021-05-31 ENCOUNTER — Encounter: Payer: Self-pay | Admitting: Obstetrics & Gynecology

## 2021-05-31 ENCOUNTER — Ambulatory Visit (INDEPENDENT_AMBULATORY_CARE_PROVIDER_SITE_OTHER): Payer: Medicaid Other | Admitting: Obstetrics & Gynecology

## 2021-05-31 VITALS — BP 122/79 | HR 106 | Wt 204.6 lb

## 2021-05-31 DIAGNOSIS — Z3480 Encounter for supervision of other normal pregnancy, unspecified trimester: Secondary | ICD-10-CM

## 2021-05-31 NOTE — Progress Notes (Signed)
? ?  LOW-RISK PREGNANCY VISIT ?Patient name: Stephanie Molina MRN [id number]  Date of birth: 2000-06-19 ?Chief Complaint:   ?Routine Prenatal Visit ? ?History of Present Illness:   ?Stephanie Molina is a 21 y.o. G4P1001 female at [redacted]w[redacted]d with an Estimated Date of Delivery: 06/18/21 being seen today for ongoing management of a low-risk pregnancy.  ? ?  03/15/2021  ? 10:04 AM 12/20/2020  ? 10:01 AM 11/26/2019  ?  9:29 AM 08/12/2019  ?  2:48 PM 06/30/2019  ?  2:48 PM  ?Depression screen PHQ 2/9  ?Decreased Interest 0 0 0 1 0  ?Down, Depressed, Hopeless 0 0 0 0 0  ?PHQ - 2 Score 0 0 0 1 0  ?Altered sleeping 1 0 0 2 1  ?Tired, decreased energy 1 1 1 1 1   ?Change in appetite 0 0 0 0 1  ?Feeling bad or failure about yourself  0 0 0 0 0  ?Trouble concentrating 0 0 0 0 0  ?Moving slowly or fidgety/restless 0 0 0 0 0  ?Suicidal thoughts 0 0 0 0 0  ?PHQ-9 Score 2 1 1 4 3   ?Difficult doing work/chores     Not difficult at all  ? ? ?Today she reports occasional contractions. Contractions: Irritability. Vag. Bleeding: None.  Movement: Present. denies leaking of fluid. ?Review of Systems:   ?Pertinent items are noted in HPI ?Denies abnormal vaginal discharge w/ itching/odor/irritation, headaches, visual changes, shortness of breath, chest pain, abdominal pain, severe nausea/vomiting, or problems with urination or bowel movements unless otherwise stated above. ?Pertinent History Reviewed:  ?Reviewed past medical,surgical, social, obstetrical and family history.  ?Reviewed problem list, medications and allergies. ? ?Physical Assessment:  ? ?Vitals:  ? 05/31/21 1525  ?BP: 122/79  ?Pulse: (!) 106  ?Weight: 204 lb 9.6 oz (92.8 kg)  ?Body mass index is 34.05 kg/m?. ?  ?     Physical Examination:  ? General appearance: Well appearing, and in no distress ? Mental status: Alert, oriented to person, place, and time ? Skin: Warm & dry ? Respiratory: Normal respiratory effort, no distress ? Abdomen: Soft, gravid, nontender ? Pelvic: Cervical exam  performed  Dilation: 2 Effacement (%): 50 Station: -2 ? Extremities: Edema: None ? Psych:  mood and affect appropriate ? ?Fetal Status: Fetal Heart Rate (bpm): 155 Fundal Height: 36 cm Movement: Present Presentation: Vertex ? ?Chaperone:  pt declined    ? ?No results found for this or any previous visit (from the past 24 hour(s)).  ? ?Assessment & Plan:  ?1) Low-risk pregnancy G2P1001 at [redacted]w[redacted]d with an Estimated Date of Delivery: 06/18/21  ? ?  ?Meds: No orders of the defined types were placed in this encounter. ? ?Labs/procedures today: none ? ?Plan:  Continue routine obstetrical care  ?Next visit: prefers in person   ? ?Reviewed: Term labor symptoms and general obstetric precautions including but not limited to vaginal bleeding, contractions, leaking of fluid and fetal movement were reviewed in detail with the patient.  All questions were answered. Pt has home bp cuff. Check bp weekly, let [redacted]w[redacted]d know if >140/90.  ? ?Follow-up: Return in about 1 week (around 06/07/2021) for LROB visit. ? ?No orders of the defined types were placed in this encounter. ? ? ?[place], DO ?Attending Obstetrician & Gynecologist, Faculty Practice ?Center for 06/09/2021, [facility] ? ? ? ?

## 2021-06-07 ENCOUNTER — Ambulatory Visit (INDEPENDENT_AMBULATORY_CARE_PROVIDER_SITE_OTHER): Payer: Medicaid Other | Admitting: Advanced Practice Midwife

## 2021-06-07 ENCOUNTER — Encounter: Payer: Self-pay | Admitting: Advanced Practice Midwife

## 2021-06-07 VITALS — BP 141/86 | HR 116 | Wt 206.0 lb

## 2021-06-07 DIAGNOSIS — O133 Gestational [pregnancy-induced] hypertension without significant proteinuria, third trimester: Secondary | ICD-10-CM

## 2021-06-07 DIAGNOSIS — Z3483 Encounter for supervision of other normal pregnancy, third trimester: Secondary | ICD-10-CM

## 2021-06-07 DIAGNOSIS — Z3A38 38 weeks gestation of pregnancy: Secondary | ICD-10-CM

## 2021-06-07 NOTE — Patient Instructions (Addendum)
In the morning, Labor and Delivery will call you when they have a slot open.  They will give you about an hour to get there.  If you are late (or don't answer your phone) your time slot will be given to the next person in line and you will be given a later time slot.  You will go to [employer] [facility], ([address] Entrance C in [place], [place]) to start your induction!  Eat a light meal before you come.  Stephanie Must!!

## 2021-06-07 NOTE — Progress Notes (Signed)
   Induction Assessment Scheduling Form: Fax to [facility]:  [phone]  Stephanie Molina                                                                                   DOB:  02-26-2000                                                            MRN:  [id number]                                                                     Phone #:    [phone]                          Provider:  [facility]  GP:  [id number]                                                            Estimated Date of Delivery: 06/18/21  Dating Criteria: early [place]    Medical Indications for induction:  GHTN Admission Date/Time:  5/18 am Gestational age on admission:  38.3   Filed Weights   06/07/21 1505  Weight: 206 lb (93.4 kg)   HIV:  Non Reactive (02/23 0851) GBS: Negative/-- (05/04 0000)     Method of induction(proposed):  cytotec   Scheduling Provider Signature:  Jacklyn Shell, CNM                                            Today's Date:  06/07/2021

## 2021-06-07 NOTE — Progress Notes (Signed)
   LOW-RISK PREGNANCY VISIT Patient name: Stephanie Molina MRN [id number]  Date of birth: 09/08/2000 Chief Complaint:   Routine Prenatal Visit  History of Present Illness:   Stephanie Molina is a 21 y.o. G54P1001 female at [redacted]w[redacted]d with an Estimated Date of Delivery: 06/18/21 being seen today for ongoing management of a low-risk pregnancy.  Today she reports having some dizziness lasat night. Contractions: Irritability. Vag. Bleeding: None.  Movement: Present. denies leaking of fluid. Review of Systems:   Pertinent items are noted in HPI Denies abnormal vaginal discharge w/ itching/odor/irritation, headaches, visual changes, shortness of breath, chest pain, abdominal pain, severe nausea/vomiting, or problems with urination or bowel movements unless otherwise stated above. Pertinent History Reviewed:  Reviewed past medical,surgical, social, obstetrical and family history.  Reviewed problem list, medications and allergies. Physical Assessment:   Vitals:   06/07/21 1505 06/07/21 1509  BP: (!) 146/88 (!) 141/86  Pulse: (!) 116   Weight: 206 lb (93.4 kg)   Body mass index is 34.28 kg/m.        Physical Examination:   General appearance: Well appearing, and in no distress  Mental status: Alert, oriented to person, place, and time  Skin: Warm & dry  Cardiovascular: Normal heart rate noted  Respiratory: Normal respiratory effort, no distress  Abdomen: Soft, gravid, nontender  Pelvic: Cervical exam performed  Dilation: 3 Effacement (%): 50 Station: -2  Extremities: Edema: None  Fetal Status: Fetal Heart Rate (bpm): 150 Fundal Height: 37 cm Movement: Present Presentation: Vertex  Chaperone: Latisha Cresenzo    No results found for this or any previous visit (from the past 24 hour(s)).  Assessment & Plan:  1) Low-risk pregnancy G2P1001 at [redacted]w[redacted]d with an Estimated Date of Delivery: 06/18/21   2) GHTN, IOL set/orders in   Meds: No orders of the defined types were placed in this  encounter.  Labs/procedures today:       Follow-up: Return in about 12 days (around 06/19/2021) for my chart video BP check; 5 weeks for PP visit (can be virtual);Marland Kitchen  No orders of the defined types were placed in this encounter.  Jacklyn Shell DNP, CNM 06/07/2021 3:30 PM

## 2021-06-07 NOTE — Progress Notes (Signed)
error 

## 2021-06-08 ENCOUNTER — Encounter (HOSPITAL_COMMUNITY): Payer: Self-pay | Admitting: Family Medicine

## 2021-06-08 ENCOUNTER — Inpatient Hospital Stay (HOSPITAL_COMMUNITY): Payer: Medicaid Other | Admitting: Anesthesiology

## 2021-06-08 ENCOUNTER — Other Ambulatory Visit: Payer: Self-pay

## 2021-06-08 ENCOUNTER — Inpatient Hospital Stay (HOSPITAL_COMMUNITY)
Admission: AD | Admit: 2021-06-08 | Discharge: 2021-06-10 | DRG: 798 | Disposition: A | Discharge status: Home / Self Care | Payer: Medicaid Other | Attending: Obstetrics and Gynecology | Admitting: Obstetrics and Gynecology

## 2021-06-08 ENCOUNTER — Inpatient Hospital Stay (HOSPITAL_COMMUNITY): Payer: Medicaid Other

## 2021-06-08 DIAGNOSIS — Z3A38 38 weeks gestation of pregnancy: Secondary | ICD-10-CM

## 2021-06-08 DIAGNOSIS — Z9851 Tubal ligation status: Secondary | ICD-10-CM

## 2021-06-08 DIAGNOSIS — O3503X0 Maternal care for (suspected) central nervous system malformation or damage in fetus, choroid plexus cysts, not applicable or unspecified: Secondary | ICD-10-CM | POA: Diagnosis present

## 2021-06-08 DIAGNOSIS — Z3490 Encounter for supervision of normal pregnancy, unspecified, unspecified trimester: Secondary | ICD-10-CM

## 2021-06-08 DIAGNOSIS — O99214 Obesity complicating childbirth: Secondary | ICD-10-CM | POA: Diagnosis present

## 2021-06-08 DIAGNOSIS — Z8759 Personal history of other complications of pregnancy, childbirth and the puerperium: Secondary | ICD-10-CM

## 2021-06-08 DIAGNOSIS — Z302 Encounter for sterilization: Secondary | ICD-10-CM

## 2021-06-08 DIAGNOSIS — Z7982 Long term (current) use of aspirin: Secondary | ICD-10-CM

## 2021-06-08 DIAGNOSIS — O134 Gestational [pregnancy-induced] hypertension without significant proteinuria, complicating childbirth: Principal | ICD-10-CM | POA: Diagnosis present

## 2021-06-08 DIAGNOSIS — Z87891 Personal history of nicotine dependence: Secondary | ICD-10-CM

## 2021-06-08 DIAGNOSIS — O09891 Supervision of other high risk pregnancies, first trimester: Secondary | ICD-10-CM

## 2021-06-08 DIAGNOSIS — O133 Gestational [pregnancy-induced] hypertension without significant proteinuria, third trimester: Principal | ICD-10-CM | POA: Diagnosis present

## 2021-06-08 LAB — CBC
HCT: 33.9 % — ABNORMAL LOW (ref 36.0–46.0)
HCT: 32.6 % — ABNORMAL LOW (ref 36.0–46.0)
Hemoglobin: 11.3 g/dL — ABNORMAL LOW (ref 12.0–15.0)
Hemoglobin: 10.7 g/dL — ABNORMAL LOW (ref 12.0–15.0)
MCH: 30.1 pg (ref 26.0–34.0)
MCH: 29.7 pg (ref 26.0–34.0)
MCHC: 33.3 g/dL (ref 30.0–36.0)
MCHC: 32.8 g/dL (ref 30.0–36.0)
MCV: 90.2 fL (ref 80.0–100.0)
MCV: 90.6 fL (ref 80.0–100.0)
Platelets: 176 10*3/uL (ref 150–400)
Platelets: 164 10*3/uL (ref 150–400)
RBC: 3.76 MIL/uL — ABNORMAL LOW (ref 3.87–5.11)
RBC: 3.6 MIL/uL — ABNORMAL LOW (ref 3.87–5.11)
RDW: 12.7 % (ref 11.5–15.5)
RDW: 12.7 % (ref 11.5–15.5)
WBC: 10.4 10*3/uL (ref 4.0–10.5)
WBC: 10.1 10*3/uL (ref 4.0–10.5)
nRBC: 0 % (ref 0.0–0.2)
nRBC: 0 % (ref 0.0–0.2)

## 2021-06-08 LAB — COMPREHENSIVE METABOLIC PANEL
ALT: 14 U/L (ref 0–44)
AST: 20 U/L (ref 15–41)
Albumin: 2.7 g/dL — ABNORMAL LOW (ref 3.5–5.0)
Alkaline Phosphatase: 112 U/L (ref 38–126)
Anion gap: 10 (ref 5–15)
BUN: 7 mg/dL (ref 6–20)
CO2: 19 mmol/L — ABNORMAL LOW (ref 22–32)
Calcium: 8.7 mg/dL — ABNORMAL LOW (ref 8.9–10.3)
Chloride: 109 mmol/L (ref 98–111)
Creatinine, Ser: 0.66 mg/dL (ref 0.44–1.00)
GFR, Estimated: >60 mL/min (ref >60)
Glucose, Bld: 121 mg/dL — ABNORMAL HIGH (ref 70–99)
Potassium: 3.5 mmol/L (ref 3.5–5.1)
Sodium: 138 mmol/L (ref 135–145)
Total Bilirubin: 0.5 mg/dL (ref 0.3–1.2)
Total Protein: 5.5 g/dL — ABNORMAL LOW (ref 6.5–8.1)

## 2021-06-08 LAB — RPR: RPR Ser Ql: NONREACTIVE

## 2021-06-08 LAB — TYPE AND SCREEN
ABO/RH(D): A POS
Antibody Screen: NEGATIVE

## 2021-06-08 LAB — PROTEIN / CREATININE RATIO, URINE
Creatinine, Urine: 58.17 mg/dL
Protein Creatinine Ratio: 0.14 mg/mg{Cre} (ref 0.00–0.15)
Total Protein, Urine: 8 mg/dL

## 2021-06-08 MED ORDER — OXYCODONE-ACETAMINOPHEN 5-325 MG PO TABS: 2.0000 | ORAL_TABLET | ORAL | Status: DC | PRN

## 2021-06-08 MED ORDER — HYDROXYZINE HCL 50 MG PO TABS
50.0000 mg | ORAL_TABLET | Freq: Four times a day (QID) | ORAL | Status: DC | PRN
Start: 2021-06-08 — End: 2021-06-09

## 2021-06-08 MED ORDER — TERBUTALINE SULFATE 1 MG/ML IJ SOLN: 0.2500 mg | Freq: Once | INTRAMUSCULAR | Status: DC | PRN

## 2021-06-08 MED ORDER — OXYCODONE-ACETAMINOPHEN 5-325 MG PO TABS: 1.0000 | ORAL_TABLET | ORAL | Status: DC | PRN

## 2021-06-08 MED ORDER — LACTATED RINGERS IV SOLN: 500.0000 mL | INTRAVENOUS | Status: DC | PRN

## 2021-06-08 MED ORDER — OXYTOCIN-SODIUM CHLORIDE 30-0.9 UT/500ML-% IV SOLN
2.5000 [IU]/h | INTRAVENOUS | Status: DC
  Administered 2021-06-09: 2.5 [IU]/h via INTRAVENOUS

## 2021-06-08 MED ORDER — ONDANSETRON HCL 4 MG/2ML IJ SOLN
4.0000 mg | Freq: Four times a day (QID) | INTRAMUSCULAR | Status: DC | PRN
  Administered 2021-06-08: 4 mg via INTRAVENOUS
  Filled 2021-06-08: qty 2

## 2021-06-08 MED ORDER — PHENYLEPHRINE 80 MCG/ML (10ML) SYRINGE FOR IV PUSH (FOR BLOOD PRESSURE SUPPORT): 80.0000 ug | PREFILLED_SYRINGE | INTRAVENOUS | Status: DC | PRN

## 2021-06-08 MED ORDER — EPHEDRINE 5 MG/ML INJ: 10.0000 mg | INTRAVENOUS | Status: DC | PRN

## 2021-06-08 MED ORDER — OXYTOCIN-SODIUM CHLORIDE 30-0.9 UT/500ML-% IV SOLN
1.0000 m[IU]/min | INTRAVENOUS | Status: DC
  Filled 2021-06-08: qty 500

## 2021-06-08 MED ORDER — LACTATED RINGERS IV SOLN
500.0000 mL | Freq: Once | INTRAVENOUS | Status: AC
  Administered 2021-06-08: 500 mL via INTRAVENOUS

## 2021-06-08 MED ORDER — ZOLPIDEM TARTRATE 5 MG PO TABS: 5.0000 mg | ORAL_TABLET | Freq: Every evening | ORAL | Status: DC | PRN

## 2021-06-08 MED ORDER — LACTATED RINGERS IV SOLN: INTRAVENOUS | Status: DC

## 2021-06-08 MED ORDER — LACTATED RINGERS IV SOLN: 500.0000 mL | Freq: Once | INTRAVENOUS | Status: DC

## 2021-06-08 MED ORDER — PHENYLEPHRINE 80 MCG/ML (10ML) SYRINGE FOR IV PUSH (FOR BLOOD PRESSURE SUPPORT)
80.0000 ug | PREFILLED_SYRINGE | INTRAVENOUS | Status: DC | PRN
  Filled 2021-06-08: qty 10

## 2021-06-08 MED ORDER — OXYTOCIN-SODIUM CHLORIDE 30-0.9 UT/500ML-% IV SOLN
1.0000 m[IU]/min | INTRAVENOUS | Status: DC
  Administered 2021-06-08: 2 m[IU]/min via INTRAVENOUS

## 2021-06-08 MED ORDER — FENTANYL-BUPIVACAINE-NACL 0.5-0.125-0.9 MG/250ML-% EP SOLN
12.0000 mL/h | EPIDURAL | Status: DC | PRN
  Administered 2021-06-08: 12 mL/h via EPIDURAL
  Filled 2021-06-08: qty 250

## 2021-06-08 MED ORDER — ACETAMINOPHEN 325 MG PO TABS
650.0000 mg | ORAL_TABLET | ORAL | Status: DC | PRN
  Administered 2021-06-08: 650 mg via ORAL
  Filled 2021-06-08: qty 2

## 2021-06-08 MED ORDER — MISOPROSTOL 25 MCG QUARTER TABLET: 25.0000 ug | ORAL_TABLET | ORAL | Status: DC | PRN

## 2021-06-08 MED ORDER — SOD CITRATE-CITRIC ACID 500-334 MG/5ML PO SOLN: 30.0000 mL | ORAL | Status: DC | PRN

## 2021-06-08 MED ORDER — OXYTOCIN BOLUS FROM INFUSION
333.0000 mL | Freq: Once | INTRAVENOUS | Status: AC
  Administered 2021-06-09: 333 mL via INTRAVENOUS

## 2021-06-08 MED ORDER — FENTANYL CITRATE (PF) 100 MCG/2ML IJ SOLN: 50.0000 ug | INTRAMUSCULAR | Status: DC | PRN

## 2021-06-08 MED ORDER — FLEET ENEMA 7-19 GM/118ML RE ENEM: 1.0000 | ENEMA | RECTAL | Status: DC | PRN

## 2021-06-08 MED ORDER — LIDOCAINE HCL (PF) 1 % IJ SOLN: 30.0000 mL | INTRAMUSCULAR | Status: DC | PRN

## 2021-06-08 MED ORDER — MISOPROSTOL 50MCG HALF TABLET
50.0000 ug | ORAL_TABLET | ORAL | Status: DC | PRN
  Administered 2021-06-08: 50 ug via ORAL
  Filled 2021-06-08: qty 1

## 2021-06-08 MED ORDER — LIDOCAINE HCL (PF) 1 % IJ SOLN
INTRAMUSCULAR | Status: DC | PRN
  Administered 2021-06-08 (×2): 5 mL via EPIDURAL

## 2021-06-08 MED ORDER — DIPHENHYDRAMINE HCL 50 MG/ML IJ SOLN: 12.5000 mg | INTRAMUSCULAR | Status: DC | PRN

## 2021-06-08 NOTE — H&P (Signed)
OBSTETRIC ADMISSION HISTORY AND PHYSICAL  Stephanie Molina is a 21 y.o. female G2P1001 with IUP at [redacted]w[redacted]d by LMP and 8 week [place] presenting for IOL for gHTN. She reports +FMs, No LOF, no VB, no blurry vision, headaches or peripheral edema, and RUQ pain.  She plans on breast and bottle feeding. She request  for birth control. (BTL consent signed on 04/05/2021) She received her prenatal care at [facility]   Dating: By LMP/early [place] --->  Estimated Date of Delivery: 06/18/21  Sono:   @[redacted]w[redacted]d , CWD, normal anatomy, cephalic presentation, anterior placental lie, 1858g, 25% EFW   Prenatal History/Complications:  gHTN Choroid plexus cysts in fetus - resolved Short interval pregnancy Past Medical History: Past Medical History:  Diagnosis Date   Gestational hypertension    Medical history non-contributory     Past Surgical History: Past Surgical History:  Procedure Laterality Date   APPENDECTOMY     LAPAROSCOPIC APPENDECTOMY N/A 02/11/2019   Procedure: APPENDECTOMY LAPAROSCOPIC;  Surgeon: 02/13/2019, MD;  Location: [facility];  Service: General;  Laterality: N/A;    Obstetrical History: OB History     Gravida  2   Para  1   Term  1   Preterm  0   AB  0   Living  1      SAB  0   IAB  0   Ectopic  0   Multiple  0   Live Births  1           Social History Social History   Socioeconomic History   Marital status: Single    Spouse name: Not on file   Number of children: Not on file   Years of education: Not on file   Highest education level: Not on file  Occupational History   Occupation: college freshman   Tobacco Use   Smoking status: Former    Types: E-cigarettes   Smokeless tobacco: Never  Berna Bue Use: Never used  Substance and Sexual Activity   Alcohol use: No   Drug use: No   Sexual activity: Yes    Birth control/protection: None  Other Topics Concern   Not on file  Social History Narrative   Not on file   Social Determinants of  Health   Financial Resource Strain: Low Risk    Difficulty of Paying Living Expenses: Not very hard  Food Insecurity: No Food Insecurity   Worried About Building services engineer in the Last Year: Never true   Ran Out of Food in the Last Year: Never true  Transportation Needs: No Transportation Needs   Lack of Transportation (Medical): No   Lack of Transportation (Non-Medical): No  Physical Activity: Insufficiently Active   Days of Exercise per Week: 2 days   Minutes of Exercise per Session: 30 min  Stress: No Stress Concern Present   Feeling of Stress : Only a little  Social Connections: Moderately Isolated   Frequency of Communication with Friends and Family: More than three times a week   Frequency of Social Gatherings with Friends and Family: Three times a week   Attends Religious Services: Never   Active Member of Clubs or Organizations: No   Attends Programme researcher, broadcasting/film/video Meetings: Never   Marital Status: Living with partner    Family History: Family History  Problem Relation Age of Onset   Heart attack Paternal Grandfather    Asthma Paternal Grandmother    Asthma Father  Allergies: Allergies  Allergen Reactions   Rocephin [Ceftriaxone Sodium In Dextrose] Swelling    Pt had eye swelling     Medications Prior to Admission  Medication Sig Dispense Refill Last Dose   aspirin 81 MG chewable tablet Chew 2 tablets (162 mg total) by mouth daily. 60 tablet 7    Prenatal Vit-Fe Fumarate-FA (PRENATAL VITAMIN) 27-0.8 MG TABS 1 tablet daily by mouth 90 tablet 3      Review of Systems   All systems reviewed and negative except as stated in HPI  Blood pressure 124/67, pulse (!) 116, temperature 98.1 F (36.7 C), temperature source Oral, resp. rate 16, last menstrual period 09/11/2020, unknown if currently breastfeeding. General appearance: alert, comfortable appearing Lungs: clear to auscultation bilaterally Heart: regular rate and rhythm Abdomen: soft, non-tender; bowel  sounds normal Extremities: Homans sign is negative, no sign of DVT Presentation: cephalic Fetal monitoring Baseline: 140 bpm, Variability: Good {> 6 bpm), Accelerations: Reactive, and Decelerations: Absent Uterine activityFrequency: irregular     Prenatal labs: ABO, Rh: A/Positive/-- (11/30 1121) Antibody: Negative (02/23 0851) Rubella: 4.79 (11/30 1121) RPR: Non Reactive (02/23 0851)  HBsAg: Negative (11/30 1121)  HIV: Non Reactive (02/23 0851)  GBS: Negative/-- (05/04 0000)  2 hr Glucola normal Genetic screening  AFP negative, LR NIPS Anatomy US  bilateral choroid plexus cysts, resolved on 4/6 [place]   Prenatal Transfer Tool  Maternal Diabetes: No Genetic Screening: Normal Maternal Ultrasounds/Referrals: bilateral choroid plexus cysts, resolved on 4/6 US  Fetal Ultrasounds or other Referrals:  None Maternal Substance Abuse:  No Significant Maternal Medications:  None Significant Maternal Lab Results: Group B Strep negative  No results found for this or any previous visit (from the past 24 hour(s)).  Patient Active Problem List   Diagnosis Date Noted   Gestational hypertension, third trimester 06/08/2021   Encounter for supervision of normal pregnancy, antepartum 12/19/2020   History of gestational hypertension 12/19/2020   Short interval between pregnancies affecting pregnancy in first trimester, antepartum 12/19/2020   Acute appendicitis 11/09/2018    Assessment/Plan:  SHALUNDA LINDH is a 21 y.o. G2P1001 at [redacted]w[redacted]d here forIOL for gHTN  #Labor: cervix in office 3 cm yesterday. Here still about the same. 50% effaced and cervix posterior Will plan for cytotec buccal. Can assess for AROM at next check if patient amenable #Pain: Plans epidural #FWB: Cat I #ID:  GBS neg #MOF: both #MOC: signed BTL papers (04/05/2021) #Circ:  N/A  #gHTN BP here 120s/60s. PreE labs sent at admission  Warner Mccreedy, MD, MPH OB Fellow, Faculty [facility]

## 2021-06-08 NOTE — Anesthesia Preprocedure Evaluation (Signed)
Anesthesia Evaluation  Patient identified by MRN, date of birth, ID band Patient awake    Reviewed: Allergy & Precautions, H&P , NPO status , Patient's Chart, lab work & pertinent test results  Airway Mallampati: II   Neck ROM: full    Dental   Pulmonary former smoker,    breath sounds clear to auscultation       Cardiovascular hypertension,  Rhythm:regular Rate:Normal     Neuro/Psych    GI/Hepatic   Endo/Other    Renal/GU      Musculoskeletal   Abdominal   Peds  Hematology   Anesthesia Other Findings   Reproductive/Obstetrics (+) Pregnancy                             Anesthesia Physical Anesthesia Plan  ASA: 1  Anesthesia Plan: Epidural   Post-op Pain Management:    Induction: Intravenous  PONV Risk Score and Plan: 2 and Treatment may vary due to age or medical condition  Airway Management Planned: Natural Airway  Additional Equipment:   Intra-op Plan:   Post-operative Plan:   Informed Consent: I have reviewed the patients History and Physical, chart, labs and discussed the procedure including the risks, benefits and alternatives for the proposed anesthesia with the patient or authorized representative who has indicated his/her understanding and acceptance.     Dental advisory given  Plan Discussed with: Anesthesiologist  Anesthesia Plan Comments:         Anesthesia Quick Evaluation

## 2021-06-08 NOTE — Anesthesia Procedure Notes (Signed)
Epidural Patient location during procedure: OB Start time: 06/08/2021 2:56 PM End time: 06/08/2021 3:05 PM  Staffing Anesthesiologist: Achille Rich, MD Performed: anesthesiologist   Preanesthetic Checklist Completed: patient identified, IV checked, site marked, risks and benefits discussed, monitors and equipment checked, pre-op evaluation and timeout performed  Epidural Patient position: sitting Prep: DuraPrep Patient monitoring: heart rate, cardiac monitor, continuous pulse ox and blood pressure Approach: midline Location: L2-L3 Injection technique: LOR saline  Needle:  Needle type: Tuohy  Needle gauge: 17 G Needle length: 9 cm Needle insertion depth: 6 cm Catheter type: closed end flexible Catheter size: 19 Gauge Catheter at skin depth: 11 cm Test dose: negative and Other  Assessment Events: blood not aspirated, injection not painful, no injection resistance and negative IV test  Additional Notes Informed consent obtained prior to proceeding including risk of failure, 1% risk of PDPH, risk of minor discomfort and bruising.  Discussed rare but serious complications including epidural abscess, permanent nerve injury, epidural hematoma.  Discussed alternatives to epidural analgesia and patient desires to proceed.  Timeout performed pre-procedure verifying patient name, procedure, and platelet count.  Patient tolerated procedure well. Reason for block:procedure for pain

## 2021-06-08 NOTE — Progress Notes (Addendum)
Stephanie Molina is a 21 y.o. G2P1001 at [redacted]w[redacted]d by LMP admitted for induction of labor due to gestational hypertension.  Subjective: [name] is doing well. She is happy that she got to eat lunch. She and Greggory Stallion (FOB) are excited to meet baby girl "Leia."   Objective: BP (!) 105/56   Pulse 99   Temp 98.7 F (37.1 C) (Axillary)   Resp 18   Ht 5\' 4"  (1.626 m)   Wt 94.5 kg   LMP 09/11/2020 (Exact Date)   Breastfeeding Unknown   BMI 35.77 kg/m  No intake/output data recorded. No intake/output data recorded.  FHT:  FHR: 135 bpm, variability: moderate,  accelerations:  Present,  decelerations:  Absent UC:   irregular, every 5-7 minutes SVE:   Dilation: 3.5 Effacement (%): 70 Station: -2 Exam by:: [email or web address], RN  Labs: Lab Results  Component Value Date   WBC 10.4 06/08/2021   HGB 11.3 (L) 06/08/2021   HCT 33.9 (L) 06/08/2021   MCV 90.2 06/08/2021   PLT 176 06/08/2021   Patient Vitals for the past 24 hrs:  BP Temp Temp src Pulse Resp Height Weight  06/08/21 1322 (!) 105/56 -- -- 99 18 -- --  06/08/21 1219 127/73 98.7 F (37.1 C) Axillary 96 -- -- --  06/08/21 1125 (!) 108/59 -- -- 86 -- -- --  06/08/21 1006 120/62 -- -- (!) 101 -- -- --  06/08/21 0906 -- -- -- -- -- 5\' 4"  (1.626 m) 94.5 kg  06/08/21 0834 (!) 98/54 -- -- 94 -- -- --  06/08/21 0713 124/67 98.1 F (36.7 C) Oral (!) 116 16 -- --     Assessment / Plan: Induction of labor due to gestational hypertension, status post cytotec x1 with cervical change.   Labor:  Irregular contractions s/p cytotec. Fetal head at -2 station. Initiate pitocin 2x2. Bedside 06/10/21 confirmed vertex presentation. Will reassess fetal station for AROM.  gHTN:   BPs stable  continue to monitor  Fetal Wellbeing:  Category I Continue to monitor for sx of distress.  Pain Control:   Pt may have epidural upon request  I/D:  GBS Neg  Anticipated MOD:  NSVD  06/10/21, CNM  06/08/2021, 1:44 PM

## 2021-06-08 NOTE — Plan of Care (Signed)
  Problem: Education: Goal: Knowledge of Childbirth will improve Outcome: Progressing Goal: Ability to make informed decisions regarding treatment and plan of care will improve Outcome: Progressing Goal: Ability to state and carry out methods to decrease the pain will improve Outcome: Progressing Goal: Individualized Educational Video(s) Outcome: Progressing   Problem: Coping: Goal: Ability to verbalize concerns and feelings about labor and delivery will improve Outcome: Progressing   Problem: Life Cycle: Goal: Ability to make normal progression through stages of labor will improve Outcome: Progressing Goal: Ability to effectively push during vaginal delivery will improve Outcome: Progressing   Problem: Role Relationship: Goal: Will demonstrate positive interactions with the child Outcome: Progressing   Problem: Safety: Goal: Risk of complications during labor and delivery will decrease Outcome: Progressing   Problem: Pain Management: Goal: Relief or control of pain from uterine contractions will improve Outcome: Progressing   Problem: Education: Goal: Knowledge of General Education information will improve Description: Including pain rating scale, medication(s)/side effects and non-pharmacologic comfort measures Outcome: Progressing   Problem: Health Behavior/Discharge Planning: Goal: Ability to manage health-related needs will improve Outcome: Progressing   Problem: Clinical Measurements: Goal: Ability to maintain clinical measurements within normal limits will improve Outcome: Progressing Goal: Will remain free from infection Outcome: Progressing Goal: Diagnostic test results will improve Outcome: Progressing Goal: Respiratory complications will improve Outcome: Progressing Goal: Cardiovascular complication will be avoided Outcome: Progressing   Problem: Activity: Goal: Risk for activity intolerance will decrease Outcome: Progressing   Problem:  Nutrition: Goal: Adequate nutrition will be maintained Outcome: Progressing   Problem: Coping: Goal: Level of anxiety will decrease Outcome: Progressing   Problem: Elimination: Goal: Will not experience complications related to bowel motility Outcome: Progressing Goal: Will not experience complications related to urinary retention Outcome: Progressing   Problem: Pain Managment: Goal: General experience of comfort will improve Outcome: Progressing   Problem: Safety: Goal: Ability to remain free from injury will improve Outcome: Progressing   Problem: Skin Integrity: Goal: Risk for impaired skin integrity will decrease Outcome: Progressing   Problem: Education: Goal: Knowledge of disease or condition will improve Outcome: Progressing Goal: Knowledge of the prescribed therapeutic regimen will improve Outcome: Progressing   Problem: Fluid Volume: Goal: Peripheral tissue perfusion will improve Outcome: Progressing   Problem: Clinical Measurements: Goal: Complications related to disease process, condition or treatment will be avoided or minimized Outcome: Progressing   

## 2021-06-08 NOTE — Progress Notes (Signed)
Stephanie Molina is a 21 y.o. G2P1001 at [redacted]w[redacted]d by induction of labor due to gHTN.  Subjective: Pt resting well with epidural.    Objective: BP (!) 89/46 (BP Location: Right Arm)   Pulse 91   Temp 97.6 F (36.4 C) (Oral)   Resp 18   Ht 5\' 4"  (1.626 m)   Wt 94.5 kg   LMP 09/11/2020 (Exact Date)   SpO2 100%   Breastfeeding Unknown   BMI 35.77 kg/m  I/O last 3 completed shifts: In: -  Out: 425 [Urine:425] No intake/output data recorded.  FHT:  FHR: 145 bpm, variability: moderate,  accelerations:  Present,  decelerations:  Absent UC:   Difficult to trace; RN at bedside adjusting for better tracing.  SVE:   Dilation: 5.5 Effacement (%): 80 Station: 0 Exam by:: [email or web address] CNM  Labs: Lab Results  Component Value Date   WBC 10.1 06/08/2021   HGB 10.7 (L) 06/08/2021   HCT 32.6 (L) 06/08/2021   MCV 90.6 06/08/2021   PLT 164 06/08/2021   After cervical exam CNM Discussed AROM with patient. Discussed R/B with patient and family at bedside. Pt agrees with plan of care.  FHT Category I prior to AROM. AROM with amnihook without difficulty. Clear fluid noted. Fetal head well applied to cervix post AROM.     Assessment / Plan: Induction of labor due to gestational hypertension,  progressing well on pitocin  Labor: Progressing on 14 milliunit of Pitocin. AROM with clear fluid.  Fetal Wellbeing:  Category I Pain Control:  Epidural I/D:   GBS Neg; no signs of infection at this time.  Anticipated MOD:  NSVD  06/10/2021, CNM  06/08/2021, 8:22 PM

## 2021-06-09 ENCOUNTER — Encounter (HOSPITAL_COMMUNITY): Payer: Self-pay | Admitting: Family Medicine

## 2021-06-09 ENCOUNTER — Encounter (HOSPITAL_COMMUNITY)
Admission: AD | Disposition: A | Discharge status: Home / Self Care | Payer: Self-pay | Source: Home / Self Care | Attending: Obstetrics and Gynecology

## 2021-06-09 ENCOUNTER — Inpatient Hospital Stay (HOSPITAL_COMMUNITY): Payer: Medicaid Other | Admitting: Anesthesiology

## 2021-06-09 DIAGNOSIS — Z3A38 38 weeks gestation of pregnancy: Secondary | ICD-10-CM | POA: Diagnosis not present

## 2021-06-09 DIAGNOSIS — Z302 Encounter for sterilization: Secondary | ICD-10-CM

## 2021-06-09 DIAGNOSIS — O134 Gestational [pregnancy-induced] hypertension without significant proteinuria, complicating childbirth: Secondary | ICD-10-CM | POA: Diagnosis not present

## 2021-06-09 DIAGNOSIS — Z9851 Tubal ligation status: Secondary | ICD-10-CM

## 2021-06-09 HISTORY — PX: TUBAL LIGATION: SHX77

## 2021-06-09 SURGERY — LIGATION, FALLOPIAN TUBE, POSTPARTUM
Anesthesia: Epidural

## 2021-06-09 MED ORDER — OXYCODONE HCL 5 MG/5ML PO SOLN: 5.0000 mg | Freq: Once | ORAL | Status: DC | PRN

## 2021-06-09 MED ORDER — DEXAMETHASONE SODIUM PHOSPHATE 4 MG/ML IJ SOLN
INTRAMUSCULAR | Status: AC
  Filled 2021-06-09: qty 1

## 2021-06-09 MED ORDER — TETANUS-DIPHTH-ACELL PERTUSSIS 5-2.5-18.5 LF-MCG/0.5 IM SUSY: 0.5000 mL | PREFILLED_SYRINGE | Freq: Once | INTRAMUSCULAR | Status: DC

## 2021-06-09 MED ORDER — PRENATAL MULTIVITAMIN CH
1.0000 | ORAL_TABLET | Freq: Every day | ORAL | Status: DC
Start: 2021-06-09 — End: 2021-06-10
  Administered 2021-06-10: 1 via ORAL
  Filled 2021-06-09: qty 1

## 2021-06-09 MED ORDER — MIDAZOLAM HCL 2 MG/2ML IJ SOLN
INTRAMUSCULAR | Status: AC
  Filled 2021-06-09: qty 2

## 2021-06-09 MED ORDER — EPHEDRINE 5 MG/ML INJ
INTRAVENOUS | Status: AC
  Filled 2021-06-09: qty 15

## 2021-06-09 MED ORDER — EPHEDRINE SULFATE (PRESSORS) 50 MG/ML IJ SOLN
INTRAMUSCULAR | Status: DC | PRN
  Administered 2021-06-09: 10 mg via INTRAVENOUS

## 2021-06-09 MED ORDER — MEPERIDINE HCL 25 MG/ML IJ SOLN: 6.2500 mg | INTRAMUSCULAR | Status: DC | PRN

## 2021-06-09 MED ORDER — ONDANSETRON HCL 4 MG/2ML IJ SOLN
INTRAMUSCULAR | Status: DC | PRN
  Administered 2021-06-09: 4 mg via INTRAVENOUS

## 2021-06-09 MED ORDER — ONDANSETRON HCL 4 MG/2ML IJ SOLN: 4.0000 mg | INTRAMUSCULAR | Status: DC | PRN

## 2021-06-09 MED ORDER — SODIUM CHLORIDE 0.9 % IR SOLN
Status: DC | PRN
  Administered 2021-06-09: 1

## 2021-06-09 MED ORDER — OXYCODONE HCL 5 MG PO TABS
5.0000 mg | ORAL_TABLET | ORAL | Status: DC | PRN
  Administered 2021-06-10: 5 mg via ORAL
  Filled 2021-06-09: qty 1

## 2021-06-09 MED ORDER — PROPOFOL 10 MG/ML IV BOLUS
INTRAVENOUS | Status: DC | PRN
  Administered 2021-06-09: 200 mg via INTRAVENOUS

## 2021-06-09 MED ORDER — KETOROLAC TROMETHAMINE 30 MG/ML IJ SOLN
INTRAMUSCULAR | Status: DC | PRN
  Administered 2021-06-09: 30 mg via INTRAVENOUS

## 2021-06-09 MED ORDER — PHENYLEPHRINE HCL (PRESSORS) 10 MG/ML IV SOLN
INTRAVENOUS | Status: DC | PRN
  Administered 2021-06-09 (×4): 160 ug via INTRAVENOUS
  Administered 2021-06-09 (×2): 80 ug via INTRAVENOUS

## 2021-06-09 MED ORDER — LIDOCAINE-EPINEPHRINE (PF) 2 %-1:200000 IJ SOLN
INTRAMUSCULAR | Status: AC
  Filled 2021-06-09: qty 20

## 2021-06-09 MED ORDER — ACETAMINOPHEN 325 MG PO TABS
650.0000 mg | ORAL_TABLET | ORAL | Status: DC | PRN
  Administered 2021-06-09: 650 mg via ORAL
  Filled 2021-06-09: qty 2

## 2021-06-09 MED ORDER — MEASLES, MUMPS & RUBELLA VAC IJ SOLR
0.5000 mL | Freq: Once | INTRAMUSCULAR | Status: DC
Start: 2021-06-10 — End: 2021-06-10

## 2021-06-09 MED ORDER — FAMOTIDINE 20 MG PO TABS
40.0000 mg | ORAL_TABLET | Freq: Once | ORAL | Status: DC
  Filled 2021-06-09: qty 2

## 2021-06-09 MED ORDER — SUCCINYLCHOLINE CHLORIDE 200 MG/10ML IV SOSY
PREFILLED_SYRINGE | INTRAVENOUS | Status: DC | PRN
  Administered 2021-06-09: 120 mg via INTRAVENOUS

## 2021-06-09 MED ORDER — FAMOTIDINE 20 MG PO TABS
40.0000 mg | ORAL_TABLET | Freq: Once | ORAL | Status: AC
Start: 2021-06-09 — End: 2021-06-09
  Administered 2021-06-09: 40 mg via ORAL
  Filled 2021-06-09: qty 2

## 2021-06-09 MED ORDER — SENNOSIDES-DOCUSATE SODIUM 8.6-50 MG PO TABS
2.0000 | ORAL_TABLET | ORAL | Status: DC
  Administered 2021-06-10: 2 via ORAL
  Filled 2021-06-09 (×2): qty 2

## 2021-06-09 MED ORDER — DEXMEDETOMIDINE (PRECEDEX) IN NS 20 MCG/5ML (4 MCG/ML) IV SYRINGE
PREFILLED_SYRINGE | INTRAVENOUS | Status: AC
Start: 2021-06-09 — End: ?
  Filled 2021-06-09: qty 5

## 2021-06-09 MED ORDER — SODIUM BICARBONATE 8.4 % IV SOLN
INTRAVENOUS | Status: DC | PRN
  Administered 2021-06-09: 5 mL via EPIDURAL
  Administered 2021-06-09: 10 mL via EPIDURAL
  Administered 2021-06-09: 3 mL via EPIDURAL

## 2021-06-09 MED ORDER — BUPIVACAINE HCL (PF) 0.5 % IJ SOLN
INTRAMUSCULAR | Status: DC | PRN
  Administered 2021-06-09: 30 mL

## 2021-06-09 MED ORDER — LIDOCAINE HCL (CARDIAC) PF 100 MG/5ML IV SOSY
PREFILLED_SYRINGE | INTRAVENOUS | Status: DC | PRN
  Administered 2021-06-09: 40 mg via INTRAVENOUS

## 2021-06-09 MED ORDER — ONDANSETRON HCL 4 MG/2ML IJ SOLN: 4.0000 mg | Freq: Once | INTRAMUSCULAR | Status: DC | PRN

## 2021-06-09 MED ORDER — STERILE WATER FOR IRRIGATION IR SOLN
Status: DC | PRN
  Administered 2021-06-09: 1

## 2021-06-09 MED ORDER — FENTANYL CITRATE (PF) 250 MCG/5ML IJ SOLN
INTRAMUSCULAR | Status: AC
  Filled 2021-06-09: qty 5

## 2021-06-09 MED ORDER — HYDROMORPHONE HCL 1 MG/ML IJ SOLN: 0.2500 mg | INTRAMUSCULAR | Status: DC | PRN

## 2021-06-09 MED ORDER — COCONUT OIL OIL: 1.0000 "application " | TOPICAL_OIL | Status: DC | PRN

## 2021-06-09 MED ORDER — SUCCINYLCHOLINE CHLORIDE 200 MG/10ML IV SOSY
PREFILLED_SYRINGE | INTRAVENOUS | Status: AC
  Filled 2021-06-09: qty 10

## 2021-06-09 MED ORDER — SIMETHICONE 80 MG PO CHEW: 80.0000 mg | CHEWABLE_TABLET | ORAL | Status: DC | PRN

## 2021-06-09 MED ORDER — FENTANYL CITRATE (PF) 250 MCG/5ML IJ SOLN
INTRAMUSCULAR | Status: DC | PRN
  Administered 2021-06-09: 100 ug via INTRAVENOUS

## 2021-06-09 MED ORDER — AMISULPRIDE (ANTIEMETIC) 5 MG/2ML IV SOLN: 10.0000 mg | Freq: Once | INTRAVENOUS | Status: DC | PRN

## 2021-06-09 MED ORDER — DIBUCAINE (PERIANAL) 1 % EX OINT: 1.0000 "application " | TOPICAL_OINTMENT | CUTANEOUS | Status: DC | PRN

## 2021-06-09 MED ORDER — ONDANSETRON HCL 4 MG PO TABS: 4.0000 mg | ORAL_TABLET | ORAL | Status: DC | PRN

## 2021-06-09 MED ORDER — LACTATED RINGERS IV SOLN: INTRAVENOUS | Status: DC

## 2021-06-09 MED ORDER — DEXMEDETOMIDINE (PRECEDEX) IN NS 20 MCG/5ML (4 MCG/ML) IV SYRINGE
PREFILLED_SYRINGE | INTRAVENOUS | Status: DC | PRN
  Administered 2021-06-09: 12 ug via INTRAVENOUS
  Administered 2021-06-09: 8 ug via INTRAVENOUS

## 2021-06-09 MED ORDER — MIDAZOLAM HCL 5 MG/5ML IJ SOLN
INTRAMUSCULAR | Status: DC | PRN
  Administered 2021-06-09: 2 mg via INTRAVENOUS

## 2021-06-09 MED ORDER — OXYCODONE HCL 5 MG PO TABS: 5.0000 mg | ORAL_TABLET | Freq: Once | ORAL | Status: DC | PRN

## 2021-06-09 MED ORDER — LIDOCAINE-EPINEPHRINE (PF) 2 %-1:200000 IJ SOLN
INTRAMUSCULAR | Status: AC
  Filled 2021-06-09: qty 40

## 2021-06-09 MED ORDER — BENZOCAINE-MENTHOL 20-0.5 % EX AERO: 1.0000 "application " | INHALATION_SPRAY | CUTANEOUS | Status: DC | PRN

## 2021-06-09 MED ORDER — IBUPROFEN 600 MG PO TABS
600.0000 mg | ORAL_TABLET | Freq: Four times a day (QID) | ORAL | Status: DC
  Administered 2021-06-09 – 2021-06-10 (×4): 600 mg via ORAL
  Filled 2021-06-09 (×4): qty 1

## 2021-06-09 MED ORDER — KETOROLAC TROMETHAMINE 30 MG/ML IJ SOLN: 30.0000 mg | Freq: Once | INTRAMUSCULAR | Status: DC | PRN

## 2021-06-09 MED ORDER — METOCLOPRAMIDE HCL 10 MG PO TABS
10.0000 mg | ORAL_TABLET | Freq: Once | ORAL | Status: AC
  Administered 2021-06-09: 10 mg via ORAL
  Filled 2021-06-09: qty 1

## 2021-06-09 MED ORDER — DEXAMETHASONE SODIUM PHOSPHATE 4 MG/ML IJ SOLN
INTRAMUSCULAR | Status: DC | PRN
  Administered 2021-06-09: 4 mg via INTRAVENOUS

## 2021-06-09 MED ORDER — FENTANYL CITRATE (PF) 100 MCG/2ML IJ SOLN
INTRAMUSCULAR | Status: AC
  Filled 2021-06-09: qty 2

## 2021-06-09 MED ORDER — FENTANYL CITRATE (PF) 100 MCG/2ML IJ SOLN
INTRAMUSCULAR | Status: DC | PRN
  Administered 2021-06-09: 100 ug via EPIDURAL

## 2021-06-09 MED ORDER — ATROPINE SULFATE 0.4 MG/ML IV SOLN
INTRAVENOUS | Status: AC
  Filled 2021-06-09: qty 1

## 2021-06-09 MED ORDER — PROPOFOL 10 MG/ML IV BOLUS
INTRAVENOUS | Status: AC
  Filled 2021-06-09: qty 20

## 2021-06-09 MED ORDER — ZOLPIDEM TARTRATE 5 MG PO TABS: 5.0000 mg | ORAL_TABLET | Freq: Every evening | ORAL | Status: DC | PRN

## 2021-06-09 MED ORDER — WITCH HAZEL-GLYCERIN EX PADS: 1.0000 "application " | MEDICATED_PAD | CUTANEOUS | Status: DC | PRN

## 2021-06-09 MED ORDER — BUPIVACAINE HCL (PF) 0.5 % IJ SOLN
INTRAMUSCULAR | Status: AC
  Filled 2021-06-09: qty 30

## 2021-06-09 MED ORDER — KETOROLAC TROMETHAMINE 30 MG/ML IJ SOLN
INTRAMUSCULAR | Status: AC
  Filled 2021-06-09: qty 2

## 2021-06-09 MED ORDER — DIPHENHYDRAMINE HCL 25 MG PO CAPS: 25.0000 mg | ORAL_CAPSULE | Freq: Four times a day (QID) | ORAL | Status: DC | PRN

## 2021-06-09 SURGICAL SUPPLY — 26 items
APL SKNCLS STERI-STRIP NONHPOA (GAUZE/BANDAGES/DRESSINGS)
BENZOIN TINCTURE PRP APPL 2/3 (GAUZE/BANDAGES/DRESSINGS) IMPLANT
BLADE SURG 11 STRL SS (BLADE) ×3 IMPLANT
CLOTH BEACON ORANGE TIMEOUT ST (SAFETY) ×3 IMPLANT
DRSG OPSITE POSTOP 3X4 (GAUZE/BANDAGES/DRESSINGS) ×3 IMPLANT
DURAPREP 26ML APPLICATOR (WOUND CARE) ×3 IMPLANT
ELECT REM PT RETURN 9FT ADLT (ELECTROSURGICAL) ×2
ELECTRODE REM PT RTRN 9FT ADLT (ELECTROSURGICAL) ×2 IMPLANT
GLOVE BIOGEL PI IND STRL 7.0 (GLOVE) ×6 IMPLANT
GLOVE BIOGEL PI INDICATOR 7.0 (GLOVE) ×3
GLOVE ECLIPSE 7.0 STRL STRAW (GLOVE) ×3 IMPLANT
GOWN STRL REUS W/TWL LRG LVL3 (GOWN DISPOSABLE) ×6 IMPLANT
LIGASURE IMPACT 36 18CM CVD LR (INSTRUMENTS) ×1 IMPLANT
NEEDLE HYPO 22GX1.5 SAFETY (NEEDLE) ×3 IMPLANT
NS IRRIG 1000ML POUR BTL (IV SOLUTION) ×3 IMPLANT
PACK ABDOMINAL MINOR (CUSTOM PROCEDURE TRAY) ×3 IMPLANT
PENCIL BUTTON HOLSTER BLD 10FT (ELECTRODE) ×3 IMPLANT
PROTECTOR NERVE ULNAR (MISCELLANEOUS) ×3 IMPLANT
SPONGE LAP 4X18 RFD (DISPOSABLE) IMPLANT
SUT VIC AB 0 CT1 27 (SUTURE) ×2
SUT VIC AB 0 CT1 27XBRD ANBCTR (SUTURE) ×2 IMPLANT
SUT VICRYL 4-0 PS2 18IN ABS (SUTURE) ×3 IMPLANT
SYR CONTROL 10ML LL (SYRINGE) ×3 IMPLANT
TOWEL OR 17X24 6PK STRL BLUE (TOWEL DISPOSABLE) ×6 IMPLANT
TRAY FOLEY CATH SILVER 14FR (SET/KITS/TRAYS/PACK) ×3 IMPLANT
WATER STERILE IRR 1000ML POUR (IV SOLUTION) ×3 IMPLANT

## 2021-06-09 NOTE — Lactation Note (Signed)
This note was copied from a baby's chart. Lactation Consultation Note  Patient Name: Stephanie Molina Today's Date: 06/09/2021 Age:21 hours  Mom and baby are sleeping upon visit. LC will come back to room at another time as possible.     Feeding Nipple Type: Slow - flow   Cyndel Griffey A Higuera Ancidey 06/09/2021, 8:15 AM

## 2021-06-09 NOTE — Anesthesia Preprocedure Evaluation (Addendum)
Anesthesia Evaluation  Patient identified by MRN, date of birth, ID band Patient awake    Reviewed: Allergy & Precautions, NPO status , Patient's Chart, lab work & pertinent test results  Airway Mallampati: III  TM Distance: >3 FB Neck ROM: Full    Dental  (+) Teeth Intact, Dental Advisory Given   Pulmonary neg pulmonary ROS, former smoker,    Pulmonary exam normal breath sounds clear to auscultation       Cardiovascular hypertension (gest HTN), Normal cardiovascular exam Rhythm:Regular Rate:Normal     Neuro/Psych negative neurological ROS  negative psych ROS   GI/Hepatic negative GI ROS, Neg liver ROS,   Endo/Other  Obesity BMI 36  Renal/GU negative Renal ROS  negative genitourinary   Musculoskeletal negative musculoskeletal ROS (+)   Abdominal (+) + obese,   Peds  Hematology  (+) Blood dyscrasia, anemia , Hb 10.7, plt 164   Anesthesia Other Findings   Reproductive/Obstetrics Desires sterility, delivered 06/09/21 0100                            Anesthesia Physical Anesthesia Plan  ASA: 2  Anesthesia Plan: Epidural   Post-op Pain Management: Toradol IV (intra-op)* and Tylenol PO (pre-op)*   Induction:   PONV Risk Score and Plan: 2  Airway Management Planned: Natural Airway  Additional Equipment: None  Intra-op Plan:   Post-operative Plan:   Informed Consent: I have reviewed the patients History and Physical, chart, labs and discussed the procedure including the risks, benefits and alternatives for the proposed anesthesia with the patient or authorized representative who has indicated his/her understanding and acceptance.     Dental advisory given  Plan Discussed with: CRNA  Anesthesia Plan Comments:        Anesthesia Quick Evaluation

## 2021-06-09 NOTE — Lactation Note (Signed)
This note was copied from a baby's chart. Lactation Consultation Note LC asked RN if mom calls out LC would like to see mom and assist in latching. RN told LC that she just set mom up w/DEBP per mom request. LC checked on mom. Mom had pumped and got 4 ml colostrum. Praised mom. Asked mom to call for [facility] for next feeding for assistance and to check latch.  Patient Name: Stephanie Molina [id number] Date: 06/09/2021 Reason for consult: Follow-up assessment;Maternal endocrine disorder;Early term 37-38.6wks Age:52 hours  Maternal Data    Feeding Mother's Current Feeding Choice: Breast Milk and Formula Nipple Type: Slow - flow  LATCH Score                    Lactation Tools Discussed/Used Tools: Pump Breast pump type: Double-Electric Breast Pump Pump Education: Milk Storage Reason for Pumping: mom request for supplentation Pumping frequency: prn Pumped volume: 4 mL  Interventions    Discharge    Consult Status Consult Status: Follow-up Date: 06/10/21 Follow-up type: In-patient    Charyl Dancer 06/09/2021, 11:48 PM

## 2021-06-09 NOTE — Op Note (Signed)
Stephanie Molina 06/08/2021 - 06/09/2021  PREOPERATIVE DIAGNOSES: Multiparity, undesired fertility  POSTOPERATIVE DIAGNOSES: Multiparity, undesired fertility  PROCEDURE:  Postpartum Bilateral Salpingectomy  SURGEON: Dr.  Verita Schneiders  ASSISTANT:  Dr. Vilma Meckel.  An experienced assistant was required given the standard of surgical care given the complexity of the case.  This assistant was needed for exposure, dissection, suctioning, retraction, instrument exchange, and for overall help during the procedure.  ANESTHESIA:  Epidural converted to General and local analgesia using 30 ml of [id number] Marcaine  COMPLICATIONS:  None immediate.  ESTIMATED BLOOD LOSS: 10 ml.  INDICATIONS:  21 y.o. G2P1001 with undesired fertility, status post vaginal delivery, desires permanent sterilization with salpingectomy.  Other reversible forms of contraception were discussed with patient; she declines all other modalities. Risks of procedure discussed with patient including but not limited to: risk of regret, permanence of method, bleeding, infection, injury to surrounding organs and need for additional procedures.  Discussed failure risk of 1% with increased risk of ectopic gestation if pregnancy occurs.  Also discussed possibility of post-tubal syndrome with increased pelvic pain or menstrual irregularities.  Patient verbalized understanding of these risks and wants to proceed with sterilization.  Written informed consent obtained.     FINDINGS:  Normal uterus, tubes, and ovaries. Excised fallopian tubes were sent to pathology.   PROCEDURE DETAILS: The patient was taken to the operating room where her epidural anesthesia was dosed up to surgical level. She was then placed in the dorsal supine position and prepped and draped in sterile fashion.  The epidural was found to be inadequate, this was converted to general anesthesia.  After an adequate timeout was performed, attention was turned to the patient's  abdomen.  A small transverse skin incision was made under the umbilical fold. The incision was taken down to the layer of fascia using the scalpel, and fascia was incised, and extended bilaterally using Mayo scissors. The peritoneum was entered in a sharp fashion.  Attention was then turned to the patient's uterus, and left fallopian tube was then identified, and Babcock clamps were then used to grasp the tube. The Ligasure device was used to excise the entire left fallopian tube.   The right fallopian tube was then identified, grasped with Babcock clamps and excised in a similar fashion using the Ligasure device allowing for bilateral salpingectomy.   Good hemostasis was noted overall.  The instruments were then removed from the patient's abdomen and the fascial incision was repaired with 0 Vicryl, and the skin was closed with a 4-0 Vicryl subcuticular stitch. The patient tolerated the procedure well.  Instrument, sponge, and needle counts were correct times three.  The patient was then taken to the recovery room awake and in stable condition.   Verita Schneiders, MD, [place] for [employer], [place]

## 2021-06-09 NOTE — Transfer of Care (Signed)
Immediate Anesthesia Transfer of Care Note  Patient: Stephanie Molina  Procedure(s) Performed: POST PARTUM TUBAL LIGATION  Patient Location: PACU  Anesthesia Type:General and Epidural  Level of Consciousness: awake, alert  and oriented  Airway & Oxygen Therapy: Patient Spontanous Breathing and Patient connected to nasal cannula oxygen  Post-op Assessment: Report given to RN and Post -op Vital signs reviewed and stable  Post vital signs: Reviewed and stable  Last Vitals:  Vitals Value Taken Time  BP 136/78 06/09/21 1704  Temp    Pulse 85 06/09/21 1707  Resp 18 06/09/21 1707  SpO2 99 % 06/09/21 1707  Vitals shown include unvalidated device data.  Last Pain:  Vitals:   06/09/21 1201  TempSrc: Oral  PainSc:       Patients Stated Pain Goal: 3 (06/08/21 2203)  Complications: No notable events documented.

## 2021-06-09 NOTE — Progress Notes (Signed)
    Faculty Practice OB/GYN Attending Postpartum Sterilization Counseling Note  21 y.o. G2P1001 s/p vaginal delivery earlier today at [redacted]w[redacted]d who desires permanent sterilization. Medicaid papers had been signed on 04/05/2021.  Other reversible forms of contraception including the most effective LARCs such as IUD or Nexplanon were discussed with patient; she declines all other modalities. Her FOB also declined vasectomy for now, after being counseled about this procedure being less invasive and more effective.  Patient was also given the option of an interval laparoscopic tubal ligation or bilateral salpingectomy which slightly increases the efficacy and is less invasive but she declined.  Details of postpartum tubal sterilization discussed in detail.   She was told that this will be performed as either salpingectomy or occlusion with Filshie clips, depending on difficulty of procedure, exposure and other factors.  Risks of procedure discussed with patient including but not limited to: risk of regret, permanence of method, bleeding, infection, injury to surrounding organs and need for additional procedures.  Failure risk of about 1-2% with increased risk of ectopic gestation if pregnancy occurs was also discussed with patient.   Also discussed possibility of post-tubal syndrome with increased pelvic pain or menstrual irregularities. Patient verbalized understanding of these risks and wants to proceed with sterilization. She really wants salpingectomy, understands that if she changes her mind and wants future pregnancies, IVF or other procedures will be needed.  She understands permanency of procedure and wishes to proceed.  Judgement appropriate.  Written informed consent obtained.  Procedure has been scheduled for 11:30 today.  Patient understands that her procedure may be delayed by any cases coming from L&D or MAU, or any other urgent events in [facility] as this is an elective procedure.    She will continue to be  NPO,  other preoperative orders placed.  Will continue close observation and postpartum care as ordered. To OR when ready.    Jaynie Collins, MD, FACOG Obstetrician & Gynecologist, [facility] for [employer], [facility]

## 2021-06-09 NOTE — Anesthesia Postprocedure Evaluation (Signed)
Anesthesia Post Note  Patient: Stephanie Molina  Procedure(s) Performed: AN AD HOC LABOR EPIDURAL     Patient location during evaluation: Mother Baby Anesthesia Type: Epidural Level of consciousness: awake and alert Pain management: pain level controlled Vital Signs Assessment: post-procedure vital signs reviewed and stable Respiratory status: spontaneous breathing, nonlabored ventilation and respiratory function stable Cardiovascular status: stable Postop Assessment: no headache, no backache and epidural receding Anesthetic complications: no   No notable events documented.  Last Vitals:  Vitals:   06/09/21 0749 06/09/21 1201  BP: 110/67 130/76  Pulse: 81 74  Resp: 17 18  Temp: 36.6 C 36.6 C  SpO2: 98% 98%    Last Pain:  Vitals:   06/09/21 1201  TempSrc: Oral  PainSc:    Pain Goal: Patients Stated Pain Goal: 3 (06/08/21 2203)                 [facility] Records

## 2021-06-09 NOTE — Lactation Note (Signed)
This note was copied from a baby's chart. Lactation Consultation Note  Patient Name: Stephanie Molina [id number] Date: 06/09/2021 Reason for consult: Initial assessment;Early term 37-38.6wks Age:21 hours  Initial visit to 8 hours old infant of a P2 mother. Mother is getting ready for BTL. Per mother, "Stephanie Molina" has been latching but seems to be difficult due to flat nipples. Mother reports using a hand pump for supplementation and collecting 57mL.  Nipple shield provided in L&D. LC encouraged to contact later to review nipple shield use and placement if mother would like to use it.  Reviewed normal newborn behavior during first 24h, expected output, tummy size and feeding frequency. Talked about pace bottled feeding, frequent burping and upright position. Compared formula feeding volume with and without breastfeeding.   Plan: 1-Feed on demand or 8-12 times in 24h period. 2-Encouraged maternal rest, hydration and food intake.  3-If formula feeding, volume according to age, pace bottled feeding, frequent burping and upright position.  Contact LC as needed for feeds/support/concerns/questionsAll questions answered at this time. Provided Lactation services brochure and promoted [employer] booklet information.      Maternal Data Has patient been taught Hand Expression?: No Does the patient have breastfeeding experience prior to this delivery?: Yes How long did the patient breastfeed?: a few weeks of pumping  Feeding Mother's Current Feeding Choice: Breast Milk and Formula Nipple Type: Slow - flow  Interventions Interventions: Breast feeding basics reviewed;Skin to skin;Expressed milk;Education;Pace feeding;LC Services brochure  Discharge Pump: Personal (evenflo and wearable one) WIC Program: Yes  Consult Status Consult Status: Follow-up Date: 06/09/21 Follow-up type: In-patient    Kwynn Schlotter A Higuera Ancidey 06/09/2021, 9:54 AM

## 2021-06-09 NOTE — Anesthesia Postprocedure Evaluation (Signed)
Anesthesia Post Note  Patient: Stephanie Molina  Procedure(s) Performed: POST PARTUM TUBAL LIGATION     Patient location during evaluation: PACU Anesthesia Type: General Level of consciousness: awake and alert, oriented and patient cooperative Pain management: pain level controlled Vital Signs Assessment: post-procedure vital signs reviewed and stable Respiratory status: spontaneous breathing, nonlabored ventilation and respiratory function stable Cardiovascular status: blood pressure returned to baseline and stable Postop Assessment: no apparent nausea or vomiting Anesthetic complications: no Comments: Epidural converted to GA    No notable events documented.  Last Vitals:  Vitals:   06/09/21 1730 06/09/21 1745  BP: 121/73 117/71  Pulse: 80 86  Resp: 18 17  Temp:    SpO2: 98% 95%    Last Pain:  Vitals:   06/09/21 1745  TempSrc:   PainSc: 0-No pain   Pain Goal: Patients Stated Pain Goal: 3 (06/08/21 2203)  LLE Motor Response: Purposeful movement (06/09/21 1745)   RLE Motor Response: Purposeful movement (06/09/21 1745)       Epidural/Spinal Function Cutaneous sensation: Tingles (06/09/21 1705), Patient able to flex knees: Yes (06/09/21 1705), Patient able to lift hips off bed: No (06/09/21 1705), Back pain beyond tenderness at insertion site: No (06/09/21 1705), Progressively worsening motor and/or sensory loss: No (06/09/21 1705), Bowel and/or bladder incontinence post epidural: No (06/09/21 1705)  Tennis Must [place]

## 2021-06-09 NOTE — Anesthesia Procedure Notes (Signed)
Procedure Name: Intubation Date/Time: 06/09/2021 4:15 PM Performed by: Elenore Paddy, CRNA Pre-anesthesia Checklist: Patient identified, Emergency Drugs available, Suction available and Patient being monitored Patient Re-evaluated:Patient Re-evaluated prior to induction Oxygen Delivery Method: Circle system utilized Preoxygenation: Pre-oxygenation with 100% oxygen Induction Type: Rapid sequence and IV induction Laryngoscope Size: Mac and 3 Grade View: Grade I Tube type: Oral Tube size: 7.0 mm Number of attempts: 1 Airway Equipment and Method: Stylet

## 2021-06-09 NOTE — Discharge Summary (Signed)
Postpartum Discharge Summary    Patient Name: Stephanie Molina DOB: 10-07-2000 MRN: [id number]  Date of admission: 06/08/2021 Delivery date:06/09/2021  Delivering provider: Serita Grammes D  Date of discharge: 06/10/2021  Admitting diagnosis: Gestational hypertension, third trimester [O13.3] Intrauterine pregnancy: [redacted]w[redacted]d    Secondary diagnosis:  Principal Problem:   Vaginal delivery Active Problems:   History of gestational hypertension   Short interval between pregnancies affecting pregnancy in first trimester, antepartum   Gestational hypertension, third trimester   S/P postpartum bilateral salpingectomy  Additional problems: None    Discharge diagnosis: Term Pregnancy Delivered and Gestational Hypertension                                              Post partum procedures: Postpartum tubal ligation (bilateral salpingectomy)  Augmentation: AROM, Pitocin, and Cytotec Complications: None  Hospital course: Induction of Labor With Vaginal Delivery   21y.o. yo G2P1001 at 371w5das admitted to the hospital 06/08/2021 for induction of labor.  Indication for induction: Gestational hypertension.  Patient had an uncomplicated labor course as follows: Membrane Rupture Time/Date: 8:10 PM ,06/08/2021   Delivery Method:Vaginal, Spontaneous  Episiotomy: None  Lacerations:    Details of delivery can be found in separate delivery note.  Patient had a postpartum course remarkable for having a ppBTL on PPD#0 without complication.  She is eating, drinking, voiding and ambulating without issue.  Her BP remained controlled in labor and postpartum without need for antihypertensives.  She will have a BP check in one week.  Her pain and bleeding are controlled.  She is formula feeding well.  Patient is discharged home 06/10/21.  Newborn Data: Birth date:06/09/2021  Birth time:12:58 AM  Gender:Female  Living status:Living  Apgars:9 ,9  Weight:3260 g (7lb 3oz)  Magnesium Sulfate received: No BMZ  received: No Rhophylac: N/A MMR: N/A T-DaP: Given prenatally Flu: Yes Transfusion: No  Physical exam  Vitals:   06/09/21 1921 06/09/21 2259 06/10/21 0249 06/10/21 0652  BP: 124/72 121/82 121/70 119/73  Pulse: (!) 101 93 86 87  Resp: '18 17 16 17  ' Temp: 98.1 F (36.7 C) 98.1 F (36.7 C) 98 F (36.7 C) 98 F (36.7 C)  TempSrc: Oral Oral Oral Oral  SpO2: 99% 97% 99% 100%  Weight:      Height:       General: alert, cooperative, and no distress Lochia: appropriate Uterine Fundus: firm and below umbilicus  Incision: healing well with no significant drainage, dressing is clean, dry, and intact  DVT Evaluation: no LE edema or calf tenderness to palpation   Labs: Lab Results  Component Value Date   WBC 10.1 06/08/2021   HGB 10.7 (L) 06/08/2021   HCT 32.6 (L) 06/08/2021   MCV 90.6 06/08/2021   PLT 164 06/08/2021      Latest Ref Rng & Units 06/08/2021    7:07 AM  CMP  Glucose 70 - 99 mg/dL 121    BUN 6 - 20 mg/dL 7    Creatinine 0.44 - 1.00 mg/dL 0.66    Sodium 135 - 145 mmol/L 138    Potassium 3.5 - 5.1 mmol/L 3.5    Chloride 98 - 111 mmol/L 109    CO2 22 - 32 mmol/L 19    Calcium 8.9 - 10.3 mg/dL 8.7    Total Protein 6.5 - 8.1 g/dL 5.5  Total Bilirubin 0.3 - 1.2 mg/dL 0.5    Alkaline Phos 38 - 126 U/L 112    AST 15 - 41 U/L 20    ALT 0 - 44 U/L 14     Edinburgh Score:    06/10/2021    8:27 AM  Edinburgh Postnatal Depression Scale Screening Tool  I have been able to laugh and see the funny side of things. 0  I have looked forward with enjoyment to things. 0  I have blamed myself unnecessarily when things went wrong. 0  I have been anxious or worried for no good reason. 0  I have felt scared or panicky for no good reason. 0  Things have been getting on top of me. 0  I have been so unhappy that I have had difficulty sleeping. 0  I have felt sad or miserable. 0  I have been so unhappy that I have been crying. 0  The thought of harming myself has occurred to me.  0  Edinburgh Postnatal Depression Scale Total 0     After visit meds:  Allergies as of 06/10/2021       Reactions   Rocephin [ceftriaxone Sodium In Dextrose] Swelling   Pt had eye swelling        Medication List     STOP taking these medications    aspirin 81 MG chewable tablet       TAKE these medications    acetaminophen 500 MG tablet Commonly known as: TYLENOL Take 2 tablets (1,000 mg total) by mouth every 8 (eight) hours as needed (pain).   ibuprofen 600 MG tablet Commonly known as: ADVIL Take 1 tablet (600 mg total) by mouth every 6 (six) hours as needed (pain).   oxyCODONE 5 MG immediate release tablet Commonly known as: Oxy IR/ROXICODONE Take 1 tablet (5 mg total) by mouth every 6 (six) hours as needed for severe pain or breakthrough pain.   Prenatal Vitamin 27-0.8 MG Tabs 1 tablet daily by mouth         Discharge home in stable condition Infant Feeding: Bottle Infant Disposition: home with mother Discharge instruction: per After Visit Summary and Postpartum booklet. Activity: Advance as tolerated. Pelvic rest for 6 weeks.  Diet: routine diet Future Appointments: Future Appointments  Date Time Provider [place]  06/19/2021 11:30 AM Danielle Rankin [facility]  07/13/2021 12:10 PM Elonda Husky Mertie Clause, MD [facility]   Follow up Visit: Postpartum BP check/visit already scheduled for 06/19/21.  Routine postpartum visit scheduled on 07/13/2021. Return precautions reviewed, patient voided understanding.   06/10/2021 Genia Del, MD

## 2021-06-10 MED ORDER — ACETAMINOPHEN 500 MG PO TABS: 1000.0000 mg | ORAL_TABLET | Freq: Three times a day (TID) | ORAL | 0 refills | Status: AC | PRN

## 2021-06-10 MED ORDER — IBUPROFEN 600 MG PO TABS: 600.0000 mg | ORAL_TABLET | Freq: Four times a day (QID) | ORAL | 0 refills | Status: AC | PRN

## 2021-06-10 MED ORDER — OXYCODONE HCL 5 MG PO TABS: 5.0000 mg | ORAL_TABLET | Freq: Four times a day (QID) | ORAL | 0 refills | Status: AC | PRN

## 2021-06-10 NOTE — Lactation Note (Signed)
This note was copied from a baby's chart. Lactation Consultation Note  Patient Name: Stephanie Molina [id number] Date: 06/10/2021 Reason for consult: Early term 37-38.6wks;Infant weight loss;Follow-up assessment;Other (Comment) (4 % weight loss , per mom the baby recently fed and i've only pumped x 2 , with drops and 5 ml. LC reviewed supply and demand, importance of consistent pumping around the clock to establish and protect milk supply.) Age:21 hours LC discussed with mom when her milk comes in it will be easier for the baby to get organized at the breast. If she is latching she may have to be fed an appetizer of EBM or formula 1st and then latch.  LC provided shells to stretch the nipple / areola complex .  LC mentioned if she is consistent with the shells between feedings except when sleeping and pumping the hormones will change the tissue.  LC reviewed BF D/C teaching and per mom has a DEBP ( 2 ) hand free and a regular DEBP . LC reviewed the amount times her day she will have to pump to establish milk supply.  Mom aware of the [facility] resources after D/C.   Maternal Data    Feeding Mother's Current Feeding Choice: Breast Milk and Formula Nipple Type: Slow - flow  LATCH Score                    Lactation Tools Discussed/Used Tools: Pump;Shells;Flanges Flange Size: 24 Breast pump type: Double-Electric Breast Pump Pumping frequency: per mom has only pumped x 2 in the last 24 hours Pumped volume: 5 mL  Interventions Interventions: Breast feeding basics reviewed;DEBP;Education;LC Services brochure  Discharge Discharge Education: Engorgement and breast care;Outpatient recommendation;Other (comment) (if needed and if mom plans to re-latch) Pump: DEBP;Personal;Hands Free (and a regular DEBP)  Consult Status Consult Status: Complete Date: 06/10/21    Myer Haff 06/10/2021, 11:43 AM

## 2021-06-11 ENCOUNTER — Encounter (HOSPITAL_COMMUNITY): Payer: Self-pay | Admitting: Obstetrics & Gynecology

## 2021-06-12 LAB — SURGICAL PATHOLOGY

## 2021-06-14 ENCOUNTER — Encounter: Payer: Medicaid Other | Admitting: Advanced Practice Midwife

## 2021-06-19 ENCOUNTER — Telehealth: Payer: Medicaid Other

## 2021-06-19 ENCOUNTER — Telehealth (HOSPITAL_COMMUNITY): Payer: Self-pay | Admitting: *Deleted

## 2021-06-19 NOTE — Telephone Encounter (Signed)
Attempted hospital discharge follow-up call. Left message for patient to return RN call. Erline Levine, RN, 06/19/21, [phone]

## 2021-07-13 ENCOUNTER — Telehealth: Payer: Medicaid Other | Admitting: Obstetrics & Gynecology

## 2021-11-07 ENCOUNTER — Ambulatory Visit (HOSPITAL_COMMUNITY): Payer: Medicaid Other

## 2022-07-24 ENCOUNTER — Emergency Department (HOSPITAL_COMMUNITY)
Admission: EM | Admit: 2022-07-24 | Discharge: 2022-07-24 | Disposition: A | Discharge status: Home / Self Care | Payer: Self-pay | Attending: Emergency Medicine | Admitting: Emergency Medicine

## 2022-07-24 ENCOUNTER — Other Ambulatory Visit: Payer: Self-pay

## 2022-07-24 ENCOUNTER — Encounter (HOSPITAL_COMMUNITY): Payer: Self-pay | Admitting: Emergency Medicine

## 2022-07-24 ENCOUNTER — Emergency Department (HOSPITAL_COMMUNITY): Payer: Self-pay

## 2022-07-24 DIAGNOSIS — R1011 Right upper quadrant pain: Secondary | ICD-10-CM | POA: Insufficient documentation

## 2022-07-24 DIAGNOSIS — R630 Anorexia: Secondary | ICD-10-CM | POA: Insufficient documentation

## 2022-07-24 LAB — CBC
HCT: 43.2 % (ref 36.0–46.0)
Hemoglobin: 14.4 g/dL (ref 12.0–15.0)
MCH: 29.6 pg (ref 26.0–34.0)
MCHC: 33.3 g/dL (ref 30.0–36.0)
MCV: 88.9 fL (ref 80.0–100.0)
Platelets: 250 10*3/uL (ref 150–400)
RBC: 4.86 MIL/uL (ref 3.87–5.11)
RDW: 12.2 % (ref 11.5–15.5)
WBC: 8.6 10*3/uL (ref 4.0–10.5)
nRBC: 0 % (ref 0.0–0.2)

## 2022-07-24 LAB — URINALYSIS, ROUTINE W REFLEX MICROSCOPIC
Bilirubin Urine: NEGATIVE
Glucose, UA: NEGATIVE mg/dL
Hgb urine dipstick: NEGATIVE
Ketones, ur: NEGATIVE mg/dL
Leukocytes,Ua: NEGATIVE
Nitrite: NEGATIVE
Protein, ur: NEGATIVE mg/dL
Specific Gravity, Urine: 1.009 (ref 1.005–1.030)
pH: 6 (ref 5.0–8.0)

## 2022-07-24 LAB — PREGNANCY, URINE: Preg Test, Ur: NEGATIVE

## 2022-07-24 LAB — COMPREHENSIVE METABOLIC PANEL
ALT: 14 U/L (ref 0–44)
AST: 16 U/L (ref 15–41)
Albumin: 4.3 g/dL (ref 3.5–5.0)
Alkaline Phosphatase: 88 U/L (ref 38–126)
Anion gap: 8 (ref 5–15)
BUN: 13 mg/dL (ref 6–20)
CO2: 25 mmol/L (ref 22–32)
Calcium: 9.2 mg/dL (ref 8.9–10.3)
Chloride: 104 mmol/L (ref 98–111)
Creatinine, Ser: 0.73 mg/dL (ref 0.44–1.00)
GFR, Estimated: >60 mL/min (ref >60)
Glucose, Bld: 101 mg/dL — ABNORMAL HIGH (ref 70–99)
Potassium: 4 mmol/L (ref 3.5–5.1)
Sodium: 137 mmol/L (ref 135–145)
Total Bilirubin: 0.6 mg/dL (ref 0.3–1.2)
Total Protein: 7.2 g/dL (ref 6.5–8.1)

## 2022-07-24 LAB — LIPASE, BLOOD: Lipase: 38 U/L (ref 11–51)

## 2022-07-24 MED ORDER — DICYCLOMINE HCL 20 MG PO TABS: 20.0000 mg | ORAL_TABLET | Freq: Two times a day (BID) | ORAL | 0 refills | Status: AC

## 2022-07-24 MED ORDER — IOHEXOL 300 MG/ML  SOLN
100.0000 mL | Freq: Once | INTRAMUSCULAR | Status: AC | PRN
  Administered 2022-07-24: 100 mL via INTRAVENOUS

## 2022-07-24 NOTE — Discharge Instructions (Addendum)
Your CT scan is negative for any acute intra-abdominal problems.  You are being placed on Bentyl to help you with your abdominal pain, you may use this as needed for pain.  Plan follow-up care with gastroenterology, you may need further testing to completely eliminate your gallbladder as the source of your symptoms.  You have been referred to Dr. Jena Gauss for this.

## 2022-07-24 NOTE — ED Provider Notes (Cosign Needed Addendum)
Patient signed out to me at shift change from [facility], [place].  Patient presenting with intermittent upper abdominal pain over the past several months, labs today are reassuring, however we are currently pending a CT abdomen and pelvis to rule out intra-abdominal pathology.  If CT imaging is negative will treat for possible colic and plan close follow-up with her primary provider.   Labs reviewed, CT imaging reviewed, all stable with no suggestion of intra-abdominal infection, obstruction or other emergent pathology.  Patient is being placed on Bentyl for as needed use, plan follow-up with PCP for recheck of symptoms and further testing is warranted.  Of note, pt states she does not have a pcp, has never seen Dr. Sudie Bailey.  Will refer to GI.   Results for orders placed or performed during the hospital encounter of 07/24/22  Lipase, blood  Result Value Ref Range   Lipase 38 11 - 51 U/L  Comprehensive metabolic panel  Result Value Ref Range   Sodium 137 135 - 145 mmol/L   Potassium 4.0 3.5 - 5.1 mmol/L   Chloride 104 98 - 111 mmol/L   CO2 25 22 - 32 mmol/L   Glucose, Bld 101 (H) 70 - 99 mg/dL   BUN 13 6 - 20 mg/dL   Creatinine, Ser 9.14 0.44 - 1.00 mg/dL   Calcium 9.2 8.9 - 78.2 mg/dL   Total Protein 7.2 6.5 - 8.1 g/dL   Albumin 4.3 3.5 - 5.0 g/dL   AST 16 15 - 41 U/L   ALT 14 0 - 44 U/L   Alkaline Phosphatase 88 38 - 126 U/L   Total Bilirubin 0.6 0.3 - 1.2 mg/dL   GFR, Estimated >95 >62 mL/min   Anion gap 8 5 - 15  CBC  Result Value Ref Range   WBC 8.6 4.0 - 10.5 K/uL   RBC 4.86 3.87 - 5.11 MIL/uL   Hemoglobin 14.4 12.0 - 15.0 g/dL   HCT 13.0 86.5 - 78.4 %   MCV 88.9 80.0 - 100.0 fL   MCH 29.6 26.0 - 34.0 pg   MCHC 33.3 30.0 - 36.0 g/dL   RDW 69.6 29.5 - 28.4 %   Platelets 250 150 - 400 K/uL   nRBC 0.0 0.0 - 0.2 %  Urinalysis, Routine w reflex microscopic -Urine, Clean Catch  Result Value Ref Range   Color, Urine YELLOW YELLOW   APPearance CLEAR CLEAR   Specific  Gravity, Urine 1.009 1.005 - 1.030   pH 6.0 5.0 - 8.0   Glucose, UA NEGATIVE NEGATIVE mg/dL   Hgb urine dipstick NEGATIVE NEGATIVE   Bilirubin Urine NEGATIVE NEGATIVE   Ketones, ur NEGATIVE NEGATIVE mg/dL   Protein, ur NEGATIVE NEGATIVE mg/dL   Nitrite NEGATIVE NEGATIVE   Leukocytes,Ua NEGATIVE NEGATIVE  Pregnancy, urine  Result Value Ref Range   Preg Test, Ur NEGATIVE NEGATIVE   CT ABDOMEN PELVIS W CONTRAST  Result Date: 07/24/2022 CLINICAL DATA:  Right upper quadrant pain EXAM: CT ABDOMEN AND PELVIS WITH CONTRAST TECHNIQUE: Multidetector CT imaging of the abdomen and pelvis was performed using the standard protocol following bolus administration of intravenous contrast. RADIATION DOSE REDUCTION: This exam was performed according to the departmental dose-optimization program which includes automated exposure control, adjustment of the mA and/or kV according to patient size and/or use of iterative reconstruction technique. CONTRAST:  OMNIPAQUE IOHEXOL 300 MG/ML  SOLN COMPARISON:  CT abdomen and pelvis with contrast December 22, 2018 FINDINGS: Lower chest: The visualized bibasilar lungs are clear. Normal heart size  and no pericardial effusion. Hepatobiliary: No focal liver abnormality is seen. No gallstones, gallbladder wall thickening, or biliary dilatation. Pancreas: Unremarkable. No pancreatic ductal dilatation or surrounding inflammatory changes. Spleen: Normal in size without focal abnormality. Adrenals/Urinary Tract: Adrenal glands are unremarkable. Kidneys are normal, without renal calculi, focal lesion, or hydronephrosis. Bladder is distended but otherwise within normal limits. Stomach/Bowel: Stomach is within normal limits. Appendix is not visualized. No evidence of bowel wall thickening, distention, or inflammatory changes. Vascular/Lymphatic: No significant vascular findings are present. No enlarged abdominal or pelvic lymph nodes. Reproductive: Uterus and bilateral adnexa are  unremarkable. Other: No abdominal wall hernia or abnormality. No abdominopelvic ascites. Musculoskeletal: No acute or significant osseous findings. IMPRESSION: No etiology for abdominal pain is identified. Electronically Signed   By: Jacob Moores M.D.   On: 07/24/2022 20:42      Victoriano Lain 07/24/22 2113    Burgess Amor, PA-C 07/24/22 2242    Vanetta Mulders, MD 07/25/22 [phone]

## 2022-07-24 NOTE — ED Triage Notes (Signed)
Pt via POV c/o RUQ abdominal pain with nausea and poor appetite x 2-3 days with pain ranging from dull and achy to sharp and stabbing. Pt reports similar symptoms in May and again in June which resolved, and she states this feels similar. Pt drinks a lot of water but says her urine is still dark in color, she feels like her skin is yellowing, and she has frequent headaches. She looked online and is concerned that she might have liver disease. Pt does not appear jaundiced in triage. Pt denies ETOH.

## 2022-07-24 NOTE — ED Provider Notes (Cosign Needed Addendum)
[place] EMERGENCY DEPARTMENT AT [facility] Provider Note   CSN: [id number] Arrival date & time: 07/24/22  1512     History  Chief Complaint  Patient presents with   Abdominal Pain    Stephanie Molina is a 22 y.o. female.  Denies any significant PMH.  Presents the ER with complaint of upper quadrant pain intermittently over the past several months, has become more constant over the past couple of days pain has been less severe than it was in the past only about 6-10 the pain scale, states her urine was dark and she read that it could be a sign of liver disease and she was very worried so came to the ER.  She has had some decreased appetite as well.  Denies worsening of symptoms after eating,however.   Abdominal Pain      Home Medications Prior to Admission medications   Medication Sig Start Date End Date Taking? Authorizing Provider  dicyclomine (BENTYL) 20 MG tablet Take 1 tablet (20 mg total) by mouth 2 (two) times daily. 07/24/22  Yes Oluwatomisin Hustead A, PA-C  acetaminophen (TYLENOL) 500 MG tablet Take 2 tablets (1,000 mg total) by mouth every 8 (eight) hours as needed (pain). 06/10/21   Worthy Rancher, MD  ibuprofen (ADVIL) 600 MG tablet Take 1 tablet (600 mg total) by mouth every 6 (six) hours as needed (pain). 06/10/21   Worthy Rancher, MD  oxyCODONE (OXY IR/ROXICODONE) 5 MG immediate release tablet Take 1 tablet (5 mg total) by mouth every 6 (six) hours as needed for severe pain or breakthrough pain. 06/10/21   Worthy Rancher, MD  Prenatal Vit-Fe Fumarate-FA (PRENATAL VITAMIN) 27-0.8 MG TABS 1 tablet daily by mouth 10/24/20   Cheral Marker, CNM      Allergies    Rocephin [ceftriaxone sodium in dextrose]    Review of Systems   Review of Systems  Gastrointestinal:  Positive for abdominal pain.    Physical Exam Updated Vital Signs BP (!) 124/97 (BP Location: Right Arm)   Pulse 83   Temp 98.1 F (36.7 C) (Oral)   Resp 16   Ht 5\' 4"  (1.626 m)    Wt 86.6 kg   LMP 07/14/2022 (Exact Date)   SpO2 97%   Breastfeeding No   BMI 32.79 kg/m  Physical Exam Vitals and nursing note reviewed.  Constitutional:      General: She is not in acute distress.    Appearance: She is well-developed.  HENT:     Head: Normocephalic and atraumatic.  Eyes:     Conjunctiva/sclera: Conjunctivae normal.  Cardiovascular:     Rate and Rhythm: Normal rate and regular rhythm.     Heart sounds: No murmur heard. Pulmonary:     Effort: Pulmonary effort is normal. No respiratory distress.     Breath sounds: Normal breath sounds.  Abdominal:     Palpations: Abdomen is soft.     Tenderness: There is abdominal tenderness in the right upper quadrant. There is no right CVA tenderness or left CVA tenderness.     Comments: Very mild right upper quadrant tenderness on deep palpation only negative Murphy sign no other tenderness throughout  Musculoskeletal:        General: No swelling.     Cervical back: Neck supple.  Skin:    General: Skin is warm and dry.     Capillary Refill: Capillary refill takes less than 2 seconds.  Neurological:     Mental Status: She  is alert.  Psychiatric:        Mood and Affect: Mood normal.     ED Results / Procedures / Treatments   Labs (all labs ordered are listed, but only abnormal results are displayed) Labs Reviewed  COMPREHENSIVE METABOLIC PANEL - Abnormal; Notable for the following components:      Result Value   Glucose, Bld 101 (*)    All other components within normal limits  LIPASE, BLOOD  CBC  URINALYSIS, ROUTINE W REFLEX MICROSCOPIC  POC URINE PREG, ED    EKG None  Radiology No results found.  Procedures Procedures    Medications Ordered in ED Medications - No data to display  ED Course/ Medical Decision Making/ A&P                             Medical Decision Making DDX: gastritis, gastroenteritis, appendicitis, cholecystitis, diverticulitis, DKA, nephrolithiasis, gastroparesis,  other Labs: I ordered and interpreted labs, negative urinalysis, CMP is normal including normal bilirubin and normal LFTs otherwise.  Lipase is normal, CBC is normal with no leukocytosis, no anemia.  ED course: Patient has been having right and right upper quadrant pain, was worried that she has had some dark urine and was concerned about possible liver disease.  She has no bilirubin in her urine, normal LFTs, normal CBC and electrolytes.  Normal lipase.  She has some mild right upper quadrant tenderness on deep palpation only.  Discussed with her she is having some pain in her right upper quadrant that goes to her back and is intermittent which could be consistent with biliary colic.  We discussed obtaining a right upper quadrant ultrasound to evaluate for gallstones.  Unfortunately ultrasound had left for the day.  We discussed risk and benefits of a CT scan.  I offered if she was feeling poorly and I felt strongly about imaging at this time we can get a CT scan but we discussed the risks of radiation associated with CT scan.  In light of her normal labs and exam and vitals, feel she needs CT at this time, I still did offer this if she felt strongly but she states she will follow-up with an outpatient doctor.  I will give her outpatient follow-up as she does not have an established PCP.  She was advised if she has worsening pain, develops nausea vomiting or any other new or worsening symptoms she should come back to the ER right away.   Subsequently changed her mind and really does want a CT scan, she is worried about going home without any imaging tonight.  She understands risks of radiation.  Discussed with oncoming team who will follow-up on the CT scan.  Amount and/or Complexity of Data Reviewed Labs: ordered. Radiology: ordered.  Risk Prescription drug management.           Final Clinical Impression(s) / ED Diagnoses Final diagnoses:  Right upper quadrant abdominal pain    Rx / DC  Orders ED Discharge Orders          Ordered    dicyclomine (BENTYL) 20 MG tablet  2 times daily        07/24/22 1932              Ma Rings, PA-C 07/24/22 1930    Ma Rings, PA-C 07/24/22 1940    Vanetta Mulders, MD 07/25/22 1259

## 2023-08-06 ENCOUNTER — Encounter (HOSPITAL_COMMUNITY): Payer: Self-pay

## 2023-08-06 ENCOUNTER — Emergency Department (HOSPITAL_COMMUNITY): Payer: Self-pay

## 2023-08-06 ENCOUNTER — Emergency Department (HOSPITAL_COMMUNITY)
Admission: EM | Admit: 2023-08-06 | Discharge: 2023-08-06 | Disposition: A | Discharge status: Home / Self Care | Payer: Self-pay | Attending: Student | Admitting: Student

## 2023-08-06 ENCOUNTER — Other Ambulatory Visit: Payer: Self-pay

## 2023-08-06 DIAGNOSIS — X501XXA Overexertion from prolonged static or awkward postures, initial encounter: Secondary | ICD-10-CM | POA: Insufficient documentation

## 2023-08-06 DIAGNOSIS — S93401A Sprain of unspecified ligament of right ankle, initial encounter: Secondary | ICD-10-CM | POA: Insufficient documentation

## 2023-08-06 MED ORDER — NAPROXEN 500 MG PO TABS: 500.0000 mg | ORAL_TABLET | Freq: Two times a day (BID) | ORAL | 0 refills | Status: AC

## 2023-08-06 NOTE — Discharge Instructions (Signed)
 Please follow-up closely with orthopedics for any continued symptoms.  Return to emergency department immediately for any new or worsening symptoms.

## 2023-08-06 NOTE — ED Provider Notes (Signed)
 [place] EMERGENCY DEPARTMENT AT [facility] Provider Note   CSN: [id number] Arrival date & time: 08/06/23  1156     Patient presents with: Foot Pain   Stephanie Molina is a 23 y.o. female.   Patient is a 22 year old female who presents emergency department the chief complaint of pain to the right foot and ankle.  She notes that she kicked a stroller a few days ago and then rolled her ankle.  Patient notes that the pain has gotten worse since that time.  She denies any swelling.  She denies any numbness or paresthesias.  She also notes that her left ring finger has been bothering her since twisting it a few months ago.   Foot Pain       Prior to Admission medications   Medication Sig Start Date End Date Taking? Authorizing Provider  acetaminophen  (TYLENOL ) 500 MG tablet Take 2 tablets (1,000 mg total) by mouth every 8 (eight) hours as needed (pain). 06/10/21   Clem Tawni HERO, MD  dicyclomine  (BENTYL ) 20 MG tablet Take 1 tablet (20 mg total) by mouth 2 (two) times daily. 07/24/22   Suellen Cantor A, PA-C  ibuprofen  (ADVIL ) 600 MG tablet Take 1 tablet (600 mg total) by mouth every 6 (six) hours as needed (pain). 06/10/21   Clem Tawni HERO, MD  oxyCODONE  (OXY IR/ROXICODONE ) 5 MG immediate release tablet Take 1 tablet (5 mg total) by mouth every 6 (six) hours as needed for severe pain or breakthrough pain. 06/10/21   Clem Tawni HERO, MD  Prenatal Vit-Fe Fumarate-FA (PRENATAL VITAMIN) 27-0.8 MG TABS 1 tablet daily by mouth 10/24/20   Kizzie Suzen SAUNDERS, CNM    Allergies: Rocephin  [ceftriaxone  sodium in dextrose ]    Review of Systems  Musculoskeletal:        Pain to the right foot and ankle, pain to the left ring finger  All other systems reviewed and are negative.   Updated Vital Signs BP 131/73   Pulse 93   Temp 98.3 F (36.8 C) (Oral)   Resp 18   Ht 5' 4 (1.626 m)   Wt 83 kg   SpO2 96%   BMI 31.41 kg/m   Physical Exam Vitals and nursing note  reviewed.  Constitutional:      Appearance: Normal appearance.  HENT:     Head: Normocephalic and atraumatic.  Eyes:     Extraocular Movements: Extraocular movements intact.     Conjunctiva/sclera: Conjunctivae normal.     Pupils: Pupils are equal, round, and reactive to light.  Cardiovascular:     Rate and Rhythm: Normal rate and regular rhythm.     Pulses: Normal pulses.     Heart sounds: Normal heart sounds.  Pulmonary:     Effort: Pulmonary effort is normal. No respiratory distress.  Musculoskeletal:        General: Normal range of motion.     Comments: Tender to palpation noted over the right ankle and foot diffusely, nontender palpation right knee or hip, DP and PT pulses 2+ distally, sensation intact distally, cap refill less than 2 seconds distally, full range of motion noted throughout, no obvious deformity or bruising, discomfort and ulceration, no lacerations or abrasions, tenderness palpation noted over the proximal aspect of the left ring finger, cap refill less than 2 seconds distally, full range of motion noted throughout, area of fibrous tissue noted along the flexor tendon sheath, no swelling, warmth, induration  Skin:    General: Skin is warm and dry.  Neurological:     General: No focal deficit present.     Mental Status: She is alert and oriented to person, place, and time. Mental status is at baseline.     (all labs ordered are listed, but only abnormal results are displayed) Labs Reviewed - No data to display  EKG: None  Radiology: DG Finger Ring Left Result Date: 08/06/2023 CLINICAL DATA:  Left fourth digit pain. EXAM: LEFT RING FINGER 2+V COMPARISON:  None Available. FINDINGS: No acute osseous or joint abnormality. IMPRESSION: Negative. Electronically Signed   By: Newell Eke M.D.   On: 08/06/2023 13:22   DG Foot Complete Right Result Date: 08/06/2023 CLINICAL DATA:  Right foot pain after a month after an injury. EXAM: RIGHT FOOT COMPLETE - 3+ VIEW  COMPARISON:  None Available. FINDINGS: No acute or healing fracture.  No joint abnormality. IMPRESSION: Negative. Electronically Signed   By: Newell Eke M.D.   On: 08/06/2023 13:22     Procedures   Medications Ordered in the ED - No data to display                                  Medical Decision Making Patient is doing well at this time and is stable for discharge home.  Discussed with patient symptoms are most consistent with sprain at this point.  She does have an area of fibrous tissue over the proximal aspect of the left ring finger.  She has no indication for abscess, cellulitis or flexor tenosynovitis.  Will place patient in an ankle splint at this time and crutches and recommend close follow-up with orthopedics on outpatient basis.  Strict turn precautions were discussed for any new or worsening symptoms.  Patient voiced understand to the plan and had no additional questions.  Amount and/or Complexity of Data Reviewed Radiology: ordered.        Final diagnoses:  None    ED Discharge Orders     None          Stephanie Molina 08/06/23 1348    [place], [place], MD 08/07/23 [phone]

## 2023-08-06 NOTE — ED Triage Notes (Signed)
 Pt arrived via pOV c/o right foot pain over the past week. Pt reports she accidentally kicked the stroller on the floor at home and reports later rolling her ankle while at the pool. Pt also reports her left right finger possibly has a bine spur at the base, and Pt is concerned of osteoarthritis.
